# Patient Record
Sex: Female | Born: 1945 | Race: White | Hispanic: No | Marital: Married | Smoking: Never smoker
Health system: Southern US, Community
[De-identification: ages and names within clinical notes are randomized; demographics above are authoritative.]

## PROBLEM LIST (undated history)

## (undated) DIAGNOSIS — M810 Age-related osteoporosis without current pathological fracture: Secondary | ICD-10-CM

## (undated) DIAGNOSIS — H269 Unspecified cataract: Secondary | ICD-10-CM

## (undated) DIAGNOSIS — T7840XA Allergy, unspecified, initial encounter: Secondary | ICD-10-CM

## (undated) DIAGNOSIS — I10 Essential (primary) hypertension: Secondary | ICD-10-CM

## (undated) DIAGNOSIS — E7800 Pure hypercholesterolemia, unspecified: Secondary | ICD-10-CM

## (undated) HISTORY — PX: COSMETIC SURGERY: SHX468

## (undated) HISTORY — DX: Allergy, unspecified, initial encounter: T78.40XA

## (undated) HISTORY — DX: Pure hypercholesterolemia, unspecified: E78.00

## (undated) HISTORY — DX: Essential (primary) hypertension: I10

## (undated) HISTORY — PX: HERNIA REPAIR: SHX51

## (undated) HISTORY — PX: VAGINAL HYSTERECTOMY: SUR661

## (undated) HISTORY — DX: Age-related osteoporosis without current pathological fracture: M81.0

## (undated) HISTORY — DX: Unspecified cataract: H26.9

---

## 1985-10-09 HISTORY — PX: NASAL SEPTUM SURGERY: SHX37

## 2013-10-09 HISTORY — PX: LAPAROSCOPIC INTERNAL HERNIA REPAIR: SHX6502

## 2015-03-31 DIAGNOSIS — M810 Age-related osteoporosis without current pathological fracture: Secondary | ICD-10-CM | POA: Diagnosis not present

## 2015-07-22 DIAGNOSIS — R7301 Impaired fasting glucose: Secondary | ICD-10-CM | POA: Diagnosis not present

## 2015-07-22 DIAGNOSIS — I10 Essential (primary) hypertension: Secondary | ICD-10-CM | POA: Diagnosis not present

## 2015-07-22 DIAGNOSIS — E785 Hyperlipidemia, unspecified: Secondary | ICD-10-CM | POA: Diagnosis not present

## 2015-07-29 DIAGNOSIS — Z1389 Encounter for screening for other disorder: Secondary | ICD-10-CM | POA: Diagnosis not present

## 2015-07-29 DIAGNOSIS — R7301 Impaired fasting glucose: Secondary | ICD-10-CM | POA: Diagnosis not present

## 2015-07-29 DIAGNOSIS — Z23 Encounter for immunization: Secondary | ICD-10-CM | POA: Diagnosis not present

## 2015-07-29 DIAGNOSIS — E785 Hyperlipidemia, unspecified: Secondary | ICD-10-CM | POA: Diagnosis not present

## 2015-07-29 DIAGNOSIS — Z139 Encounter for screening, unspecified: Secondary | ICD-10-CM | POA: Diagnosis not present

## 2015-07-29 DIAGNOSIS — Z0000 Encounter for general adult medical examination without abnormal findings: Secondary | ICD-10-CM | POA: Diagnosis not present

## 2015-07-29 DIAGNOSIS — I10 Essential (primary) hypertension: Secondary | ICD-10-CM | POA: Diagnosis not present

## 2015-09-23 DIAGNOSIS — M810 Age-related osteoporosis without current pathological fracture: Secondary | ICD-10-CM | POA: Diagnosis not present

## 2017-08-22 DIAGNOSIS — E559 Vitamin D deficiency, unspecified: Secondary | ICD-10-CM | POA: Insufficient documentation

## 2019-07-14 ENCOUNTER — Other Ambulatory Visit: Payer: Self-pay

## 2019-07-14 ENCOUNTER — Encounter: Payer: Self-pay | Admitting: Osteopathic Medicine

## 2019-07-14 ENCOUNTER — Ambulatory Visit (INDEPENDENT_AMBULATORY_CARE_PROVIDER_SITE_OTHER): Payer: Medicare Other | Admitting: Osteopathic Medicine

## 2019-07-14 VITALS — BP 135/79 | HR 81 | Temp 98.5°F | Ht 64.0 in | Wt 133.5 lb

## 2019-07-14 DIAGNOSIS — E782 Mixed hyperlipidemia: Secondary | ICD-10-CM

## 2019-07-14 DIAGNOSIS — Z23 Encounter for immunization: Secondary | ICD-10-CM | POA: Diagnosis not present

## 2019-07-14 DIAGNOSIS — M810 Age-related osteoporosis without current pathological fracture: Secondary | ICD-10-CM | POA: Diagnosis not present

## 2019-07-14 MED ORDER — ALENDRONATE SODIUM 70 MG PO TABS: 70.0000 mg | ORAL_TABLET | ORAL | 3 refills | Status: DC

## 2019-07-14 MED ORDER — SIMVASTATIN 40 MG PO TABS: 40.0000 mg | ORAL_TABLET | Freq: Every day | ORAL | 3 refills | Status: DC

## 2019-07-14 MED ORDER — ZOSTER VAC RECOMB ADJUVANTED 50 MCG/0.5ML IM SUSR: 0.5000 mL | Freq: Once | INTRAMUSCULAR | 1 refills | Status: AC

## 2019-07-14 NOTE — Progress Notes (Signed)
HPI: Heidi Barber is a 73 y.o. female who  has a past medical history of High cholesterol.  she presents to [facility] today, 07/14/19,  for chief complaint of: New to establish care History of high cholesterol, osteoporosis Questions about supplements  Pleasant new patient here to establish care.  Recently moved to the area from [place] in order to be closer to her daughter who lives locally.  She has another daughter who lives [place], she will be going to visit this daughter and grandson pretty soon.  Used to work as a Runner, broadcasting/film/video, she is retired now.  Hyperlipidemia: Takes simvastatin, gets decent amount of activity/exercise.  Osteoporosis: Takes Fosamax weekly.  No problems with this medication.  1 of her daughters recommended possibly taking turmeric and elderberry supplements to help with antioxidants/anti-inflammatory   Past medical, surgical, social and family history reviewed:  Patient Active Problem List   Diagnosis Date Noted  . Age-related osteoporosis without current pathological fracture 07/14/2019  . Mixed hyperlipidemia 07/14/2019    Past Surgical History:  Procedure Laterality Date  . LAPAROSCOPIC INTERNAL HERNIA REPAIR  2015  . NASAL SEPTUM SURGERY  1987  . VAGINAL HYSTERECTOMY      Social History   Tobacco Use  . Smoking status: Never Smoker  . Smokeless tobacco: Never Used  Substance Use Topics  . Alcohol use: Never    Frequency: Never    Family History  Problem Relation Age of Onset  . Stroke Mother   . High blood pressure Father   . Skin cancer Father      Current medication list and allergy/intolerance information reviewed:    Current Outpatient Medications  Medication Sig Dispense Refill  . alendronate (FOSAMAX) 70 MG tablet TAKE 1 TABLET BY MOUTH ONE TIME PER WEEK    . aspirin 81 MG EC tablet Take 81 mg by mouth daily. Swallow whole.    . Calcium Carbonate-Vitamin D (CALCIUM 600+D)  600-200 MG-UNIT TABS Take by mouth.    . Cholecalciferol (VITAMIN D3) 50 MCG (2000 UT) TABS Take by mouth.    . Coenzyme Q10 (COQ10) 100 MG CAPS Take by mouth.    . Multiple Vitamins-Minerals (OCUVITE ADULT 50+) CAPS Take by mouth.    . simvastatin (ZOCOR) 40 MG tablet TAKE ONE TABLET BY MOUTH EVERY DAY IN THE EVENING AT 6 PM    . Vitamin E 180 MG CAPS Take by mouth.     No current facility-administered medications for this visit.     Allergies  Allergen Reactions  . Sulfa Antibiotics Itching and Rash      Review of Systems:  Constitutional:  No  fever, no chills, No recent illness, No unintentional weight changes. No significant fatigue.   HEENT: No  headache, no vision change, no hearing change, No sore throat, No  sinus pressure  Cardiac: No  chest pain, No  pressure, No palpitations, No  Orthopnea  Respiratory:  No  shortness of breath. No  Cough  Gastrointestinal: No  abdominal pain, No  nausea, No  vomiting,  No  blood in stool, No  diarrhea, No  constipation   Musculoskeletal: No new myalgia/arthralgia  Skin: No  Rash, No other wounds/concerning lesions  Genitourinary: No  incontinence, No  abnormal genital bleeding, No abnormal genital discharge  Hem/Onc: No  easy bruising/bleeding, No  abnormal lymph node  Endocrine: No cold intolerance,  No heat intolerance. No polyuria/polydipsia/polyphagia   Neurologic: No  weakness, No  dizziness, No  slurred speech/focal weakness/facial droop  Psychiatric: No  concerns with depression, No  concerns with anxiety, No sleep problems, No mood problems  Exam:  BP 135/79 (BP Location: Left Arm, Patient Position: Sitting, Cuff Size: Normal)   Pulse 81   Temp 98.5 F (36.9 C) (Oral)   Ht 5\' 4"  (1.626 m)   Wt 133 lb 8 oz (60.6 kg)   BMI 22.92 kg/m   Constitutional: VS see above. General Appearance: alert, well-developed, well-nourished, NAD  Eyes: Normal lids and conjunctive, non-icteric sclera  Ears, Nose, Mouth, Throat:  TM normal bilaterally.   Neck: No masses, trachea midline. No thyroid enlargement. No tenderness/mass appreciated. No lymphadenopathy  Respiratory: Normal respiratory effort. no wheeze, no rhonchi, no rales  Cardiovascular: S1/S2 normal, no murmur, no rub/gallop auscultated. RRR. No lower extremity edema.   Gastrointestinal: Nontender, no masses. No hepatomegaly, no splenomegaly. No hernia appreciated. Bowel sounds normal. Rectal exam deferred.   Musculoskeletal: Gait normal. No clubbing/cyanosis of digits.   Neurological: Normal balance/coordination. No tremor. No cranial nerve deficit on limited exam. Motor and sensation intact and symmetric. Cerebellar reflexes intact.   Skin: warm, dry, intact. No rash/ulcer. No concerning nevi or subq nodules on limited exam.    Psychiatric: Normal judgment/insight. Normal mood and affect. Oriented x3.        ASSESSMENT/PLAN: The primary encounter diagnosis was Age-related osteoporosis without current pathological fracture. Diagnoses of Mixed hyperlipidemia and Need for shingles vaccine were also pertinent to this visit.  Doing well on current meds OK to try OTC supplements, not much evidence for beenfit   Meds ordered this encounter  Medications  . alendronate (FOSAMAX) 70 MG tablet    Sig: Take 1 tablet (70 mg total) by mouth once a week. Take with a full glass of water on an empty stomach.    Dispense:  12 tablet    Refill:  3  . simvastatin (ZOCOR) 40 MG tablet    Sig: Take 1 tablet (40 mg total) by mouth daily.    Dispense:  90 tablet    Refill:  3  . Zoster Vaccine Adjuvanted [facility]) injection    Sig: Inject 0.5 mLs into the muscle once for 1 dose. Repeat in 2-6 months. Please fax confirmation of vaccination to Dr Sheppard Coil [phone]    Dispense:  0.5 mL    Refill:  1        Visit summary with medication list and pertinent instructions was printed for patient to review. All questions at time of visit were answered -  patient instructed to contact office with any additional concerns or updates. ER/RTC precautions were reviewed with the patient.    Please note: voice recognition software was used to produce this document, and typos may escape review. Please contact Dr. Sheppard Coil for any needed clarifications.     Follow-up plan: Return in about 4 months (around 11/14/2019) for [place].

## 2019-11-10 NOTE — Progress Notes (Signed)
Subjective:   Heidi Barber is a 74 y.o. female who presents for Medicare Annual (Subsequent) preventive examination.  Review of Systems:  No ROS.  Medicare Wellness Virtual Visit.  Visual/audio telehealth visit, UTA vital signs.   See social history for additional risk factors.    Cardiac Risk Factors include: advanced age (>60men, >51 women) Sleep patterns:Getting 7-8 hours of sleep a night. Wakes up 1-2 times a night to void. Wakes up and feels rested and ready for the day   Home Safety/Smoke Alarms: Feels safe in home. Smoke alarms in place.  Living environment; Lives with husband in a 1 story home no stairs in or around the home. SHower is walk in shower and no grab bars in place. Seat Belt Safety/Bike Helmet: Wears seat belt.   Female:   Pap- Aged out      Mammo-  UTD     Dexa scan- declines       CCS- UTD     Objective:     Vitals: BP (!) 148/68   Pulse 87   Ht 5\' 4"  (1.626 m)   Wt 133 lb (60.3 kg)   SpO2 98%   BMI 22.83 kg/m   Body mass index is 22.83 kg/m.  Advanced Directives 11/17/2019  Does Patient Have a Medical Advance Directive? Yes  Type of 01/15/2020 of [place];Living will  Does patient want to make changes to medical advance directive? No - Patient declined  Copy of Healthcare Power of Attorney in Chart? No - copy requested    Tobacco Social History   Tobacco Use  Smoking Status Never Smoker  Smokeless Tobacco Never Used     Counseling given: No   Clinical Intake:  Pre-visit preparation completed: Yes  Pain : No/denies pain     Nutritional Risks: None Diabetes: No  How often do you need to have someone help you when you read instructions, pamphlets, or other written materials from your doctor or pharmacy?: 1 - Never What is the last grade level you completed in school?: 18  Interpreter Needed?: No  Information entered by :: [id number], LPN  Past Medical History:  Diagnosis Date  . High cholesterol     Past Surgical History:  Procedure Laterality Date  . LAPAROSCOPIC INTERNAL HERNIA REPAIR  2015  . NASAL SEPTUM SURGERY  1987  . VAGINAL HYSTERECTOMY     Family History  Problem Relation Age of Onset  . Stroke Mother   . High blood pressure Father   . Skin cancer Father   . Stroke Maternal Grandmother    Social History   Socioeconomic History  . Marital status: Married    Spouse name: Richard  . Number of children: 2  . Years of education: 32  . Highest education level: Master's degree (e.g., MA, MS, MEng, MEd, MSW, MBA)  Occupational History  . Occupation: Retired    Comment: 15  Tobacco Use  . Smoking status: Never Smoker  . Smokeless tobacco: Never Used  Substance and Sexual Activity  . Alcohol use: Never  . Drug use: Never  . Sexual activity: Not Currently    Partners: Male  Other Topics Concern  . Not on file  Social History Narrative   Retired Runner, broadcasting/film/video.  Coffee daily   Social Determinants of Health   Financial Resource Strain:   . Difficulty of Paying Living Expenses: Not on file  Food Insecurity:   . Worried About Runner, broadcasting/film/video in the Last Year: Not  on file  . Ran Out of Food in the Last Year: Not on file  Transportation Needs:   . Lack of Transportation (Medical): Not on file  . Lack of Transportation (Non-Medical): Not on file  Physical Activity:   . Days of Exercise per Week: Not on file  . Minutes of Exercise per Session: Not on file  Stress:   . Feeling of Stress : Not on file  Social Connections:   . Frequency of Communication with Friends and Family: Not on file  . Frequency of Social Gatherings with Friends and Family: Not on file  . Attends Religious Services: Not on file  . Active Member of Clubs or Organizations: Not on file  . Attends Archivist Meetings: Not on file  . Marital Status: Not on file    Outpatient Encounter Medications as of 11/17/2019  Medication Sig  . alendronate (FOSAMAX) 70 MG tablet Take 1  tablet (70 mg total) by mouth once a week. Take with a full glass of water on an empty stomach.  Marland Kitchen aspirin 81 MG EC tablet Take 81 mg by mouth daily. Swallow whole.  . Calcium Carbonate-Vitamin D (CALCIUM 600+D) 600-200 MG-UNIT TABS Take by mouth.  . Cholecalciferol (VITAMIN D3) 50 MCG (2000 UT) TABS Take by mouth.  . Coenzyme Q10 (COQ10) 100 MG CAPS Take by mouth.  . Multiple Vitamins-Minerals ([place] 50+) CAPS Take by mouth.  . simvastatin (ZOCOR) 40 MG tablet Take 1 tablet (40 mg total) by mouth daily.  . Vitamin E 180 MG CAPS Take by mouth.   No facility-administered encounter medications on file as of 11/17/2019.    Activities of Daily Living In your present state of health, do you have any difficulty performing the following activities: 11/17/2019  Hearing? N  Vision? N  Difficulty concentrating or making decisions? N  Walking or climbing stairs? N  Dressing or bathing? N  Doing errands, shopping? N  Preparing Food and eating ? N  Using the Toilet? N  In the past six months, have you accidently leaked urine? Y  Comment when laughs or coughs will notice some  Do you have problems with loss of bowel control? N  Managing your Medications? N  Managing your Finances? N  Housekeeping or managing your Housekeeping? N  Some recent data might be hidden    Patient Care Team: Emeterio Reeve, DO as PCP - General (Osteopathic Medicine)    Assessment:   This is a routine wellness examination for Heidi Barber.Physical assessment deferred to PCP.  Exercise Activities and Dietary recommendations Current Exercise Habits: Home exercise routine, Type of exercise: walking, Time (Minutes): 40, Frequency (Times/Week): 6, Weekly Exercise (Minutes/Week): 240, Intensity: Mild, Exercise limited by: None identified Diet  Vegan style diet with chicken and fish. Soy milk. No red meats Breakfast: cereal with toast and peanut butter. Lunch: skips Dinner:  Meat, vegetables and salad.   Drinks 1  bottle of water daily.  Goals    . Weight (lb) < 200 lb (90.7 kg)     Patient stated would like to loose 3-5 lbs       Fall Risk Fall Risk  11/17/2019  Falls in the past year? 0  Risk for fall due to : No Fall Risks  Follow up Falls prevention discussed   Is the patient's home free of loose throw rugs in walkways, pet beds, electrical cords, etc?   yes      Grab bars in the bathroom? no  Handrails on the stairs?   no      Adequate lighting?   yes   Depression Screen PHQ 2/9 Scores 07/14/2019  PHQ - 2 Score 0  PHQ- 9 Score 0     Cognitive Function     6CIT Screen 11/17/2019  What Year? 0 points  What month? 0 points  What time? 0 points  Count back from 20 0 points  Months in reverse 0 points  Repeat phrase 0 points  Total Score 0    Immunization History  Administered Date(s) Administered  . Fluad Quad(high Dose 65+) 06/10/2019  . Influenza-Unspecified 07/08/2014, 07/29/2015, 06/10/2019  . PFIZER SARS-COV-2 Vaccination 11/04/2019  . Pneumococcal Conjugate-13 12/07/2014  . Pneumococcal Polysaccharide-23 01/20/2016  . Td 06/05/2011  . Tdap 06/05/2011  . Zoster Recombinat (Shingrix) 11/29/2015    Screening Tests Health Maintenance  Topic Date Due  . DEXA SCAN  06/12/2011  . MAMMOGRAM  11/18/2020  . TETANUS/TDAP  03/09/2026  . COLONOSCOPY  08/06/2028  . INFLUENZA VACCINE  Completed  . Hepatitis C Screening  Completed  . PNA vac Low Risk Adult  Completed        Plan:    Please schedule your next medicare wellness visit with me in 1 yr.  Ms. Kirsch , Thank you for taking time to come for your Medicare Wellness Visit. I appreciate your ongoing commitment to your health goals. Please review the following plan we discussed and let me know if I can assist you in the future.   Continue doing brain stimulating activities (puzzles, reading, adult coloring books, staying active) to keep memory sharp.  Bring a copy of your living will and/or healthcare power of  attorney to your next office visit.   These are the goals we discussed: Goals    . Weight (lb) < 200 lb (90.7 kg)     Patient stated would like to loose 3-5 lbs       This is a list of the screening recommended for you and due dates:  Health Maintenance  Topic Date Due  . DEXA scan (bone density measurement)  06/12/2011  . Mammogram  11/18/2020  . Tetanus Vaccine  03/09/2026  . Colon Cancer Screening  08/06/2028  . Flu Shot  Completed  .  Hepatitis C: One time screening is recommended by Center for Disease Control  (CDC) for  adults born from [birth year] through 1965.   Completed  . Pneumonia vaccines  Completed      I have personally reviewed and noted the following in the patient's chart:   . Medical and social history . Use of alcohol, tobacco or illicit drugs  . Current medications and supplements . Functional ability and status . Nutritional status . Physical activity . Advanced directives . List of other physicians . Hospitalizations, surgeries, and ER visits in previous 12 months . Vitals . Screenings to include cognitive, depression, and falls . Referrals and appointments  In addition, I have reviewed and discussed with patient certain preventive protocols, quality metrics, and best practice recommendations. A written personalized care plan for preventive services as well as general preventive health recommendations were provided to patient.     Normand Sloop, LPN  03/09/4430

## 2019-11-17 ENCOUNTER — Other Ambulatory Visit: Payer: Self-pay

## 2019-11-17 ENCOUNTER — Ambulatory Visit (INDEPENDENT_AMBULATORY_CARE_PROVIDER_SITE_OTHER): Payer: Medicare PPO | Admitting: *Deleted

## 2019-11-17 VITALS — BP 148/68 | HR 87 | Ht 64.0 in | Wt 133.0 lb

## 2019-11-17 DIAGNOSIS — Z0000 Encounter for general adult medical examination without abnormal findings: Secondary | ICD-10-CM | POA: Diagnosis not present

## 2019-11-17 NOTE — Patient Instructions (Signed)
Please schedule your next medicare wellness visit with me in 1 yr.  Heidi Barber , Thank you for taking time to come for your Medicare Wellness Visit. I appreciate your ongoing commitment to your health goals. Please review the following plan we discussed and let me know if I can assist you in the future.   Continue doing brain stimulating activities (puzzles, reading, adult coloring books, staying active) to keep memory sharp.  Bring a copy of your living will and/or healthcare power of attorney to your next office visit.  These are the goals we discussed: Goals    . Weight (lb) < 200 lb (90.7 kg)     Patient stated would like to loose 3-5 lbs

## 2019-12-08 ENCOUNTER — Other Ambulatory Visit: Payer: Self-pay

## 2019-12-08 ENCOUNTER — Encounter: Payer: Self-pay | Admitting: Osteopathic Medicine

## 2019-12-08 ENCOUNTER — Ambulatory Visit (INDEPENDENT_AMBULATORY_CARE_PROVIDER_SITE_OTHER): Payer: Medicare PPO | Admitting: Osteopathic Medicine

## 2019-12-08 VITALS — BP 164/84 | HR 62 | Temp 98.5°F | Ht 64.0 in | Wt 134.0 lb

## 2019-12-08 DIAGNOSIS — E782 Mixed hyperlipidemia: Secondary | ICD-10-CM

## 2019-12-08 DIAGNOSIS — Z1231 Encounter for screening mammogram for malignant neoplasm of breast: Secondary | ICD-10-CM

## 2019-12-08 DIAGNOSIS — Z0000 Encounter for general adult medical examination without abnormal findings: Secondary | ICD-10-CM | POA: Diagnosis not present

## 2019-12-08 DIAGNOSIS — M810 Age-related osteoporosis without current pathological fracture: Secondary | ICD-10-CM

## 2019-12-08 MED ORDER — ALENDRONATE SODIUM 70 MG PO TABS: 70.0000 mg | ORAL_TABLET | ORAL | 3 refills | Status: DC

## 2019-12-08 NOTE — Progress Notes (Signed)
Heidi Barber is a 74 y.o. female who presents to  [place] [facility]  today, 12/08/19, seeking care for the following: . Annual      ASSESSMENT & PLAN with other pertinent history/findings:  The primary encounter diagnosis was Annual physical exam. Diagnoses of Mixed hyperlipidemia, Age-related osteoporosis without current pathological fracture, and Encounter for screening mammogram for malignant neoplasm of breast were also pertinent to this visit.  HTN above goal RTC nurse visit BP check - t would like to work on diet/exercise, recheck at home. Advised close f/u  BP Readings from Last 3 Encounters:  12/08/19 (!) 164/84, manual recheck 145/90  11/17/19 (!) 148/68  07/14/19 135/79      Patient Instructions  General Preventive Care  Most recent routine screening lipids/other labs: ordered.   Tobacco: don't! Alcohol: responsible moderation is ok for most adults - if you have concerns about your alcohol intake, please talk to me!   Exercise: as tolerated to reduce risk of cardiovascular disease and diabetes. Strength training will also prevent osteoporosis.   Mental health: if need for mental health care (medicines, counseling, other), or concerns about moods, please let me know!   Sexual health: if need for STD testing, or if concerns with libido/pain problems, please let me know!   Advanced Directive: Living Will and/or Healthcare Power of Attorney recommended for all adults, regardless of age or health.  Vaccines  Flu vaccine: recommended for almost everyone, every fall.   Shingles vaccine: Due for booster 12 weeks after last COVID vaccine!   Pneumonia vaccines: all done!   Tetanus booster: Tdap due 2027  COVID vaccine(s) ASAP!  Cancer screenings   Colon cancer screening: recommended for everyone at age 66-75  Breast cancer screening: mammogram annually, optional after age 35  Cervical cancer screening: Can  usually stop Pap at age 79 or w/ hysterectomy.   Lung cancer screening: not needed for non-smokers  Infection screenings . HIV: recommended screening at least once age 41-65, more often as needed. . Gonorrhea/Chlamydia: screening as needed . Hepatitis C: recommended once for anyone born 1945-1965, done!  . TB: certain at-risk populations, or depending on work requirements and/or travel history Other . Bone Density Test: recommended for women at age 29     Orders Placed This Encounter  Procedures  . DG BONE DENSITY (DXA)  . MM 3D SCREEN BREAST BILATERAL  . CBC  . COMPLETE METABOLIC PANEL WITH GFR  . Lipid panel    Meds ordered this encounter  Medications  . alendronate (FOSAMAX) 70 MG tablet    Sig: Take 1 tablet (70 mg total) by mouth once a week. Take with a full glass of water on an empty stomach.    Dispense:  12 tablet    Refill:  3       Follow-up instructions: Return in about 3 months (around 03/09/2020) for nurse visit BP check .                                         BP (!) 164/84   Pulse 62   Temp 98.5 F (36.9 C) (Oral)   Ht 5\' 4"  (1.626 m)   Wt 134 lb (60.8 kg)   SpO2 99% Comment: on RA  BMI 23.00 kg/m   Constitutional:  . VSS, see nurse notes . General Appearance: alert, well-developed, well-nourished, NAD  Eyes: . Normal lids and conjunctive, non-icteric sclera . PERRLA Ears, Nose, Mouth, Throat: . Normal external auditory canal and TM bilaterally Neck: . No masses, trachea midline . No thyroid enlargement/tenderness/mass appreciated Respiratory: . Normal respiratory effort . No dullness/hyper-resonance to percussion . Breath sounds normal, no wheeze/rhonchi/rales Cardiovascular: . S1/S2 normal, no murmur/rub/gallop auscultated . No carotid bruit or JVD . No lower extremity edema Gastrointestinal: . Nontender, no masses . No hepatomegaly, no splenomegaly . No hernia appreciated Musculoskeletal:   . Gait normal . No clubbing/cyanosis of digits Neurological: . No cranial nerve deficit on limited exam . Motor and sensation intact and symmetric Psychiatric: . Normal judgment/insight . Normal mood and affect    Current Meds  Medication Sig  . alendronate (FOSAMAX) 70 MG tablet Take 1 tablet (70 mg total) by mouth once a week. Take with a full glass of water on an empty stomach.  Marland Kitchen aspirin 81 MG EC tablet Take 81 mg by mouth daily. Swallow whole.  . Calcium Carbonate-Vitamin D (CALCIUM 600+D) 600-200 MG-UNIT TABS Take by mouth.  . Cholecalciferol (VITAMIN D3) 50 MCG (2000 UT) TABS Take by mouth.  . Coenzyme Q10 (COQ10) 100 MG CAPS Take by mouth.  . Multiple Vitamins-Minerals (OCUVITE ADULT 50+) CAPS Take by mouth.  . simvastatin (ZOCOR) 40 MG tablet Take 1 tablet (40 mg total) by mouth daily.  . Vitamin E 180 MG CAPS Take by mouth.  . [DISCONTINUED] alendronate (FOSAMAX) 70 MG tablet Take 1 tablet (70 mg total) by mouth once a week. Take with a full glass of water on an empty stomach.    No results found for this or any previous visit (from the past 72 hour(s)).  No results found.  Depression screen [facility] 2/9 12/08/2019 07/14/2019  Decreased Interest 0 0  Down, Depressed, Hopeless 0 0  PHQ - 2 Score 0 0  Altered sleeping 0 0  Tired, decreased energy 1 0  Change in appetite 1 0  Feeling bad or failure about yourself  0 0  Trouble concentrating 0 0  Moving slowly or fidgety/restless 0 0  Suicidal thoughts 0 0  PHQ-9 Score 2 0  Difficult doing work/chores Not difficult at all -    GAD 7 : Generalized Anxiety Score 12/08/2019 07/14/2019  Nervous, Anxious, on Edge 0 0  Control/stop worrying 0 0  Worry too much - different things 0 1  Trouble relaxing 0 0  Restless 0 0  Easily annoyed or irritable 0 0  Afraid - awful might happen 0 0  Total GAD 7 Score 0 1  Anxiety Difficulty - Not difficult at all      All questions at time of visit were answered - patient instructed  to contact office with any additional concerns or updates.  ER/RTC precautions were reviewed with the patient.  Please note: voice recognition software was used to produce this document, and typos may escape review. Please contact Dr. Lyn Hollingshead for any needed clarifications.

## 2019-12-08 NOTE — Patient Instructions (Addendum)
General Preventive Care  Most recent routine screening lipids/other labs: ordered.   Tobacco: don't! Alcohol: responsible moderation is ok for most adults - if you have concerns about your alcohol intake, please talk to me!   Exercise: as tolerated to reduce risk of cardiovascular disease and diabetes. Strength training will also prevent osteoporosis.   Mental health: if need for mental health care (medicines, counseling, other), or concerns about moods, please let me know!   Sexual health: if need for STD testing, or if concerns with libido/pain problems, please let me know!   Advanced Directive: Living Will and/or Healthcare Power of Attorney recommended for all adults, regardless of age or health.  Vaccines  Flu vaccine: recommended for almost everyone, every fall.   Shingles vaccine: Due for booster 12 weeks after last COVID vaccine!   Pneumonia vaccines: all done!   Tetanus booster: Tdap due 2027  COVID vaccine(s) ASAP!  Cancer screenings   Colon cancer screening: recommended for everyone at age 67-75  Breast cancer screening: mammogram annually, optional after age 52  Cervical cancer screening: Can usually stop Pap at age 38 or w/ hysterectomy.   Lung cancer screening: not needed for non-smokers  Infection screenings . HIV: recommended screening at least once age 12-65, more often as needed. . Gonorrhea/Chlamydia: screening as needed . Hepatitis C: recommended once for anyone born 1945-1965, done!  . TB: certain at-risk populations, or depending on work requirements and/or travel history Other . Bone Density Test: recommended for women at age 52

## 2019-12-11 LAB — COMPLETE METABOLIC PANEL WITH GFR
AG Ratio: 1.6 (calc) (ref 1.0–2.5)
ALT: 15 U/L (ref 6–29)
AST: 17 U/L (ref 10–35)
Albumin: 4.1 g/dL (ref 3.6–5.1)
Alkaline phosphatase (APISO): 67 U/L (ref 37–153)
BUN: 14 mg/dL (ref 7–25)
CO2: 29 mmol/L (ref 20–32)
Calcium: 9.7 mg/dL (ref 8.6–10.4)
Chloride: 106 mmol/L (ref 98–110)
Creat: 0.66 mg/dL (ref 0.60–0.93)
GFR, Est African American: 102 mL/min/{1.73_m2} (ref >=60)
GFR, Est Non African American: 88 mL/min/{1.73_m2} (ref >=60)
Globulin: 2.6 g/dL (calc) (ref 1.9–3.7)
Glucose, Bld: 96 mg/dL (ref 65–99)
Potassium: 4.6 mmol/L (ref 3.5–5.3)
Sodium: 141 mmol/L (ref 135–146)
Total Bilirubin: 0.4 mg/dL (ref 0.2–1.2)
Total Protein: 6.7 g/dL (ref 6.1–8.1)

## 2019-12-11 LAB — LIPID PANEL
Cholesterol: 149 mg/dL (ref <200)
HDL: 51 mg/dL (ref >=50)
LDL Cholesterol (Calc): 85 mg/dL (calc)
Non-HDL Cholesterol (Calc): 98 mg/dL (calc) (ref <130)
Total CHOL/HDL Ratio: 2.9 (calc) (ref <5.0)
Triglycerides: 57 mg/dL (ref <150)

## 2019-12-11 LAB — CBC
HCT: 39.5 % (ref 35.0–45.0)
Hemoglobin: 12.4 g/dL (ref 11.7–15.5)
MCH: 27 pg (ref 27.0–33.0)
MCHC: 31.4 g/dL — ABNORMAL LOW (ref 32.0–36.0)
MCV: 86.1 fL (ref 80.0–100.0)
MPV: 10.2 fL (ref 7.5–12.5)
Platelets: 307 10*3/uL (ref 140–400)
RBC: 4.59 10*6/uL (ref 3.80–5.10)
RDW: 17.9 % — ABNORMAL HIGH (ref 11.0–15.0)
WBC: 7.6 10*3/uL (ref 3.8–10.8)

## 2020-01-28 ENCOUNTER — Ambulatory Visit (INDEPENDENT_AMBULATORY_CARE_PROVIDER_SITE_OTHER): Payer: Medicare PPO

## 2020-01-28 ENCOUNTER — Other Ambulatory Visit: Payer: Self-pay

## 2020-01-28 DIAGNOSIS — Z780 Asymptomatic menopausal state: Secondary | ICD-10-CM | POA: Diagnosis not present

## 2020-01-28 DIAGNOSIS — Z1231 Encounter for screening mammogram for malignant neoplasm of breast: Secondary | ICD-10-CM

## 2020-01-28 DIAGNOSIS — M810 Age-related osteoporosis without current pathological fracture: Secondary | ICD-10-CM

## 2020-03-09 ENCOUNTER — Other Ambulatory Visit: Payer: Self-pay

## 2020-03-09 ENCOUNTER — Ambulatory Visit (INDEPENDENT_AMBULATORY_CARE_PROVIDER_SITE_OTHER): Payer: Medicare PPO | Admitting: Osteopathic Medicine

## 2020-03-09 VITALS — BP 147/78 | HR 63 | Ht 64.0 in | Wt 132.0 lb

## 2020-03-09 DIAGNOSIS — E782 Mixed hyperlipidemia: Secondary | ICD-10-CM

## 2020-03-09 DIAGNOSIS — R030 Elevated blood-pressure reading, without diagnosis of hypertension: Secondary | ICD-10-CM

## 2020-03-09 NOTE — Progress Notes (Signed)
LDL is not too low - this is the bad cholesterol so the lower the better  I don't she needs to change her Rx but if she'd like to take it every other day that's fine   BP Readings from Last 3 Encounters:  03/09/20 (!) 147/78  12/08/19 (!) 164/84  11/17/19 (!) 148/68

## 2020-03-09 NOTE — Progress Notes (Signed)
   Subjective:    Patient ID: Heidi Barber, female    DOB: 1945/10/24, 74 y.o.   MRN: [id number]  HPI Patient presents to office for 3 month blood pressure check. Patient denies any headaches or high blood pressure readings at home. Denies any increased stress and has minimized salt intake.    Review of Systems     Objective:   Physical Exam        Assessment & Plan:  First blood pressure check was elevated at 175/80.Patient sat and for 10 minutes and recheck was 147/78. Advised patient she should started checking blood pressures at home periodically and record them for [place].   Patient also expressed concerns for her LDL being "too low" at 75. Patient requesting change in medication where she is taking 30 mg of simvastatin a day or a total of 80 mg over a span of 3 days. Wanting to know the best way to do this.   HM: COVID vaccine documented. Patient advised to get second Shingrix at pharmacy and send [place] the date it was completed.

## 2020-03-10 MED ORDER — SIMVASTATIN 40 MG PO TABS: 40.0000 mg | ORAL_TABLET | ORAL | 3 refills | Status: DC

## 2020-03-10 NOTE — Addendum Note (Signed)
Addended by: Jed Limerick on: 03/10/2020 10:14 AM   Modules accepted: Orders

## 2020-03-10 NOTE — Progress Notes (Signed)
Patient advised and will start taking QOD, med list updated. Did you want any changes with BP medication?

## 2020-04-13 ENCOUNTER — Encounter: Payer: Self-pay | Admitting: Osteopathic Medicine

## 2020-04-26 ENCOUNTER — Emergency Department
Admission: EM | Admit: 2020-04-26 | Discharge: 2020-04-26 | Disposition: A | Discharge status: Home / Self Care | Payer: Medicare PPO | Source: Home / Self Care

## 2020-04-26 ENCOUNTER — Other Ambulatory Visit: Payer: Self-pay

## 2020-04-26 DIAGNOSIS — L02216 Cutaneous abscess of umbilicus: Secondary | ICD-10-CM | POA: Diagnosis not present

## 2020-04-26 MED ORDER — DOXYCYCLINE HYCLATE 100 MG PO CAPS: 100.0000 mg | ORAL_CAPSULE | Freq: Two times a day (BID) | ORAL | 0 refills | Status: AC

## 2020-04-26 NOTE — Discharge Instructions (Signed)
°  Please take antibiotics as prescribed and be sure to complete entire course even if you start to feel better to ensure infection does not come back. ° °Follow up in 3-4 days if not improving, sooner if worsening.  °

## 2020-04-26 NOTE — ED Provider Notes (Signed)
[facility]    CSN: [id number] Arrival date & time: 04/26/20  1208      History   Chief Complaint Chief Complaint  Patient presents with  . Wound Check    Surgical    HPI Heidi Barber is a 74 y.o. female.   HPI Heidi Barber is a 74 y.o. female presenting to UC with c/o 2-3 weeks of gradually worsening redness, swelling and oozing of blood and pus from umbilicus where she had laparoscopic hernia surgery in 2015.  She has not had any issues from the surgery until 2-3 weeks ago. She has tried OTC antibiotic ointment but no relief. Denies fever, chills, n/v/d.    Past Medical History:  Diagnosis Date  . High cholesterol     Patient Active Problem List   Diagnosis Date Noted  . Age-related osteoporosis without current pathological fracture 07/14/2019  . Mixed hyperlipidemia 07/14/2019    Past Surgical History:  Procedure Laterality Date  . LAPAROSCOPIC INTERNAL HERNIA REPAIR  2015  . NASAL SEPTUM SURGERY  1987  . VAGINAL HYSTERECTOMY      OB History   No obstetric history on file.      Home Medications    Prior to Admission medications   Medication Sig Start Date End Date Taking? Authorizing Provider  alendronate (FOSAMAX) 70 MG tablet Take 1 tablet (70 mg total) by mouth once a week. Take with a full glass of water on an empty stomach. 12/08/19   Sunnie Nielsen, DO  aspirin 81 MG EC tablet Take 81 mg by mouth daily. Swallow whole.    [provider]  Calcium Carbonate-Vitamin D (CALCIUM 600+D) 600-200 MG-UNIT TABS Take by mouth.    [provider]  Cholecalciferol (VITAMIN D3) 50 MCG (2000 UT) TABS Take by mouth.    [provider]  Coenzyme Q10 (COQ10) 100 MG CAPS Take by mouth.    [provider]  doxycycline (VIBRAMYCIN) 100 MG capsule Take 1 capsule (100 mg total) by mouth 2 (two) times daily for 7 days. 04/26/20 05/03/20  Lurene Shadow, PA-C  simvastatin (ZOCOR) 40 MG tablet Take 1 tablet (40 mg  total) by mouth every other day. 03/10/20   Sunnie Nielsen, DO  Vitamin E 180 MG CAPS Take by mouth.    [provider]    Family History Family History  Problem Relation Age of Onset  . Stroke Mother   . High blood pressure Father   . Skin cancer Father   . Hypertension Father   . Stroke Maternal Grandmother     Social History Social History   Tobacco Use  . Smoking status: Never Smoker  . Smokeless tobacco: Never Used  Vaping Use  . Vaping Use: Never used  Substance Use Topics  . Alcohol use: Never  . Drug use: Never     Allergies   Codeine and Sulfa antibiotics   Review of Systems Review of Systems  Constitutional: Negative for chills and fever.  Gastrointestinal: Positive for abdominal pain (umbilicus). Negative for diarrhea, nausea and vomiting.  Skin: Positive for color change and wound.     Physical Exam Triage Vital Signs ED Triage Vitals  Enc Vitals Group     BP 04/26/20 1222 (!) 145/85     Pulse Rate 04/26/20 1222 67     Resp 04/26/20 1222 18     Temp 04/26/20 1222 99 F (37.2 C)     Temp Source 04/26/20 1222 Oral     SpO2  04/26/20 1222 99 %     Weight --      Height --      Head Circumference --      Peak Flow --      Pain Score 04/26/20 1220 0     Pain Loc --      Pain Edu? --      Excl. in GC? --    No data found.  Updated Vital Signs BP (!) 145/85 (BP Location: Left Arm)   Pulse 67   Temp 99 F (37.2 C) (Oral)   Resp 18   SpO2 99%   Visual Acuity Right Eye Distance:   Left Eye Distance:   Bilateral Distance:    Right Eye Near:   Left Eye Near:    Bilateral Near:     Physical Exam Vitals and nursing note reviewed.  Constitutional:      Appearance: Normal appearance. She is well-developed.  HENT:     Head: Normocephalic and atraumatic.  Cardiovascular:     Rate and Rhythm: Normal rate.  Pulmonary:     Effort: Pulmonary effort is normal.  Abdominal:    Musculoskeletal:        General: Normal range of  motion.     Cervical back: Normal range of motion.  Skin:    General: Skin is warm and dry.  Neurological:     Mental Status: She is alert and oriented to person, place, and time.  Psychiatric:        Behavior: Behavior normal.      UC Treatments / Results  Labs (all labs ordered are listed, but only abnormal results are displayed) Labs Reviewed - No data to display  EKG   Radiology No results found.  Procedures Procedures (including critical care time)  Medications Ordered in UC Medications - No data to display  Initial Impression / Assessment and Plan / UC Course  I have reviewed the triage vital signs and the nursing notes.  Pertinent labs & imaging results that were available during my care of the patient were reviewed by me and considered in my medical decision making (see chart for details).     Hx and exam c/w skin abscess Will tx with doxycycline Encouraged warm compresses F/u with PCP 3-4 days, may return to UC if needed AVS given  Final Clinical Impressions(s) / UC Diagnoses   Final diagnoses:  Cutaneous abscess of umbilicus     Discharge Instructions      Please take antibiotics as prescribed and be sure to complete entire course even if you start to feel better to ensure infection does not come back.  Follow up in 3-4 days if not improving, sooner if worsening.    ED Prescriptions    Medication Sig Dispense Auth. Provider   doxycycline (VIBRAMYCIN) 100 MG capsule Take 1 capsule (100 mg total) by mouth 2 (two) times daily for 7 days. 14 capsule Lurene Shadow, [place]     PDMP not reviewed this encounter.   Lurene Shadow, [place] 04/26/20 1347

## 2020-04-26 NOTE — ED Triage Notes (Signed)
Patient presents to Urgent Care with complaints of oozing surgical site since 2-3 weeks ago. Patient reports she had an umbilical hernia repair in 2015. Pt states some yellowish fluid leaks out, also some blood but very light. Area is inflamed and draining upon arrival. Pt tried to get in w her PCP but her call has not yet been returned. Pt has been applying antibiotic ointment to the wound.

## 2020-04-30 ENCOUNTER — Encounter: Payer: Self-pay | Admitting: Osteopathic Medicine

## 2020-04-30 ENCOUNTER — Ambulatory Visit (INDEPENDENT_AMBULATORY_CARE_PROVIDER_SITE_OTHER): Payer: Medicare PPO | Admitting: Osteopathic Medicine

## 2020-04-30 VITALS — BP 133/79 | HR 63 | Wt 133.0 lb

## 2020-04-30 DIAGNOSIS — L0291 Cutaneous abscess, unspecified: Secondary | ICD-10-CM | POA: Diagnosis not present

## 2020-04-30 NOTE — Progress Notes (Signed)
HPI: Heidi Barber is a 74 y.o. female who  has a past medical history of High cholesterol.  she presents to [facility] today, 04/30/20,  for chief complaint of: Abscess follow up  Patient reports she went to UC 4 days ago for an area of redness and swelling near her belly button. She states she previously had a surgical repair of an umbilical hernia in that area in 2015, but has not had any issues since that time. UC told her the area was superficial and would drain on its own, and discharged her with 7 days of doxycycline. She states the redness and swelling have improved since then. She has kept a bandage on the area because there has been continuous drainage of clear mucous-like fluid and a very small amount of blood. But she states there has never been significant pain to the area. She denies fever, chills, abdominal pain, nausea, vomiting, diarrhea, constipation.       Past medical, surgical, social and family history reviewed:  Patient Active Problem List   Diagnosis Date Noted  . Age-related osteoporosis without current pathological fracture 07/14/2019  . Mixed hyperlipidemia 07/14/2019    Past Surgical History:  Procedure Laterality Date  . LAPAROSCOPIC INTERNAL HERNIA REPAIR  2015  . NASAL SEPTUM SURGERY  1987  . VAGINAL HYSTERECTOMY      Social History   Tobacco Use  . Smoking status: Never Smoker  . Smokeless tobacco: Never Used  Substance Use Topics  . Alcohol use: Never    Family History  Problem Relation Age of Onset  . Stroke Mother   . High blood pressure Father   . Skin cancer Father   . Hypertension Father   . Stroke Maternal Grandmother      Current medication list and allergy/intolerance information reviewed:    Current Outpatient Medications  Medication Sig Dispense Refill  . alendronate (FOSAMAX) 70 MG tablet Take 1 tablet (70 mg total) by mouth once a week. Take with a full glass of water on an empty  stomach. 12 tablet 3  . aspirin 81 MG EC tablet Take 81 mg by mouth daily. Swallow whole.    . Calcium Carbonate-Vitamin D (CALCIUM 600+D) 600-200 MG-UNIT TABS Take by mouth.    . Cholecalciferol (VITAMIN D3) 50 MCG (2000 UT) TABS Take by mouth.    . Coenzyme Q10 (COQ10) 100 MG CAPS Take by mouth.    . doxycycline (VIBRAMYCIN) 100 MG capsule Take 1 capsule (100 mg total) by mouth 2 (two) times daily for 7 days. 14 capsule 0  . simvastatin (ZOCOR) 40 MG tablet Take 1 tablet (40 mg total) by mouth every other day. 90 tablet 3  . Vitamin E 180 MG CAPS Take by mouth.     No current facility-administered medications for this visit.    Allergies  Allergen Reactions  . Morphine And Related Nausea And Vomiting  . Codeine Nausea Only  . Other     Hay fever  . Sulfa Antibiotics Itching and Rash      Review of Systems:  Constitutional:  No  fever, no chills  HEENT: No  headache  Cardiac: No  chest pain  Respiratory:  No  shortness of breath. No  Cough  Gastrointestinal: No  abdominal pain, No  nausea, No  vomiting, No  diarrhea, No  constipation   Musculoskeletal: No new myalgia/arthralgia  Skin: Red bump on stomach  Neurologic: No  weakness, No  dizziness  Psychiatric: No  concerns with depression, No  concerns with anxiety, No sleep problems, No mood problems  Exam:  BP (!) 133/79 (BP Location: Left Arm, Patient Position: Sitting)   Pulse 63   Wt 133 lb (60.3 kg)   SpO2 97%   BMI 22.83 kg/m   Constitutional: VS see above. General Appearance: alert, well-developed, well-nourished, NAD  Eyes: Normal lids and conjunctive, non-icteric sclera  Neck: No masses, trachea midline.  Respiratory: Normal respiratory effort. no wheeze, no rhonchi, no rales  Cardiovascular: S1/S2 normal, no murmur, no rub/gallop auscultated. RRR.   Gastrointestinal: Nontender, no masses.  Musculoskeletal: Gait normal. No clubbing/cyanosis of digits.   Neurological: Normal  balance/coordination. No tremor. No cranial nerve deficit on limited exam.   Skin: (Image below) 2cm x 2cm area of erythema and swelling just left of the umbilicus with a central area of ulceration with visible granulation tissue. Scant amount of serosanguinous drainage, but no purulent discharge. No tenderness to palpation. No fluctuance, nontender   Psychiatric: Normal judgment/insight. Normal mood and affect. Oriented x3.       No results found for this or any previous visit (from the past 72 hour(s)).  No results found.   ASSESSMENT/PLAN: The encounter diagnosis was Abscess.   Abscess vs late surgical scar complication / sinus   Area is firm and nontender. I&D would likely not elicit anything at this time. Area has improved with doxycycline treatment, and it appears to be healing with visible granulation tissue. No fluctuance noted to necessitate I& D at this time   Recommended patient complete doxycycline prescription (set to finish on Sunday 7/25) and send [place] a mychart message if wound worsens or does not improve by Tuesday.  Given it's at the site of previous surgical scar, would have low threshold for getting general surgery involved to help w/ management here if it's not healing or if it gets worse        Visit summary with medication list and pertinent instructions was printed for patient to review. All questions at time of visit were answered - patient instructed to contact office with any additional concerns or updates. ER/RTC precautions were reviewed with the patient.   Please note: voice recognition software was used to produce this document, and typos may escape review. Please contact Dr. Lyn Hollingshead for any needed clarifications.   Total time spent 30 mins   Follow-up plan: Return if symptoms worsen or fail to improve.   Heidi Barber, [facility] MS3

## 2020-07-22 ENCOUNTER — Other Ambulatory Visit: Payer: Self-pay | Admitting: Osteopathic Medicine

## 2020-11-17 ENCOUNTER — Ambulatory Visit: Payer: Medicare PPO

## 2020-11-26 ENCOUNTER — Ambulatory Visit: Payer: Medicare PPO | Admitting: Osteopathic Medicine

## 2020-11-26 ENCOUNTER — Other Ambulatory Visit: Payer: Self-pay

## 2020-11-26 VITALS — BP 149/78 | HR 66 | Temp 98.4°F | Ht 63.5 in | Wt 135.0 lb

## 2020-11-26 DIAGNOSIS — R030 Elevated blood-pressure reading, without diagnosis of hypertension: Secondary | ICD-10-CM

## 2020-11-26 DIAGNOSIS — Z0000 Encounter for general adult medical examination without abnormal findings: Secondary | ICD-10-CM

## 2020-11-26 DIAGNOSIS — M810 Age-related osteoporosis without current pathological fracture: Secondary | ICD-10-CM | POA: Diagnosis not present

## 2020-11-26 DIAGNOSIS — E782 Mixed hyperlipidemia: Secondary | ICD-10-CM

## 2020-11-26 NOTE — Patient Instructions (Addendum)
Fat and Cholesterol Restricted Eating Plan Eating a diet that limits fat and cholesterol may help lower your risk for heart disease and other conditions. Your body needs fat and cholesterol for basic functions, but eating too much of these things can be harmful to your health. Your health care provider may order lab tests to check your blood fat (lipid) and cholesterol levels. This helps your health care provider understand your risk for certain conditions and whether you need to make diet changes. Work with your health care provider or dietitian to make an eating plan that is right for you. Your plan includes:  Limit your fat intake to ______% or less of your total calories a day.  Limit your saturated fat intake to ______% or less of your total calories a day.  Limit the amount of cholesterol in your diet to less than _________mg a day.  Eat ___________ g of fiber a day. What are tips for following this plan? General guidelines  If you are overweight, work with your health care provider to lose weight safely. Losing just 5-10% of your body weight can improve your overall health and help prevent diseases such as diabetes and heart disease.  Avoid: ? Foods with added sugar. ? Fried foods. ? Foods that contain partially hydrogenated oils, including stick margarine, some tub margarines, cookies, crackers, and other baked goods.  Limit alcohol intake to no more than 1 drink a day for nonpregnant women and 2 drinks a day for men. One drink equals 12 oz of beer, 5 oz of wine, or 1 oz of hard liquor.   Reading food labels  Check food labels for: ? Trans fats, partially hydrogenated oils, or high amounts of saturated fat. Avoid foods that contain saturated fat and trans fat. ? The amount of cholesterol in each serving. Try to eat no more than 200 mg of cholesterol each day. ? The amount of fiber in each serving. Try to eat at least 20-30 g of fiber each day.  Choose foods with healthy fats,  such as: ? Monounsaturated and polyunsaturated fats. These include olive and canola oil, flaxseeds, walnuts, almonds, and seeds. ? Omega-3 fats. These are found in foods such as salmon, mackerel, sardines, tuna, flaxseed oil, and ground flaxseeds.  Choose grain products that have whole grains. Look for the word "whole" as the first word in the ingredient list. Cooking  Cook foods using methods other than frying. Baking, boiling, grilling, and broiling are some healthy options.  Eat more home-cooked food and less restaurant, buffet, and fast food.  Avoid cooking using saturated fats. ? Animal sources of saturated fats include meats, butter, and cream. ? Plant sources of saturated fats include palm oil, palm kernel oil, and coconut oil. Meal planning  At meals, imagine dividing your plate into fourths: ? Fill one-half of your plate with vegetables and green salads. ? Fill one-fourth of your plate with whole grains. ? Fill one-fourth of your plate with lean protein foods.  Eat fish that is high in omega-3 fats at least two times a week.  Eat more foods that contain fiber, such as whole grains, beans, apples, broccoli, carrots, peas, and barley. These foods help promote healthy cholesterol levels in the blood.   Recommended foods Grains  Whole grains, such as whole wheat or whole grain breads, crackers, cereals, and pasta. Unsweetened oatmeal, bulgur, barley, quinoa, or brown rice. Corn or whole wheat flour tortillas. Vegetables  Fresh or frozen vegetables (raw, steamed, roasted, or grilled). Green   Green salads. Fruits  All fresh, canned (in natural juice), or frozen fruits. Meats and other protein foods  Ground beef (85% or leaner), grass-fed beef, or beef trimmed of fat. Skinless chicken or [place]. Ground chicken or [place]. Pork trimmed of fat. All fish and seafood. Egg whites. Dried beans, peas, or lentils. Unsalted nuts or seeds. Unsalted canned beans. Natural nut butters without  added sugar and oil. Dairy  Low-fat or nonfat dairy products, such as skim or 1% milk, 2% or reduced-fat cheeses, low-fat and fat-free ricotta or cottage cheese, or plain low-fat and nonfat yogurt. Fats and oils  Tub margarine without trans fats. Light or reduced-fat mayonnaise and salad dressings. Avocado. Olive, canola, sesame, or safflower oils. The items listed above may not be a complete list of foods and beverages you can eat. Contact a dietitian for more information. Foods to avoid Grains  White bread. White pasta. White rice. Cornbread. Bagels, pastries, and croissants. Crackers and snack foods that contain trans fat and hydrogenated oils. Vegetables  Vegetables cooked in cheese, cream, or butter sauce. Fried vegetables. Fruits  Canned fruit in heavy syrup. Fruit in cream or butter sauce. Fried fruit. Meats and other protein foods  Fatty cuts of meat. Ribs, chicken wings, bacon, sausage, bologna, salami, chitterlings, fatback, hot dogs, bratwurst, and packaged lunch meats. Liver and organ meats. Whole eggs and egg yolks. Chicken and [place] with skin. Fried meat. Dairy  Whole or 2% milk, cream, half-and-half, and cream cheese. Whole milk cheeses. Whole-fat or sweetened yogurt. Full-fat cheeses. Nondairy creamers and whipped toppings. Processed cheese, cheese spreads, and cheese curds. Beverages  Alcohol. Sugar-sweetened drinks such as sodas, lemonade, and fruit drinks. Fats and oils  Butter, stick margarine, lard, shortening, ghee, or bacon fat. Coconut, palm kernel, and palm oils. Sweets and desserts  Corn syrup, sugars, honey, and molasses. Candy. Jam and jelly. Syrup. Sweetened cereals. Cookies, pies, cakes, donuts, muffins, and ice cream. The items listed above may not be a complete list of foods and beverages you should avoid. Contact a dietitian for more information. Summary  Your body needs fat and cholesterol for basic functions. However, eating too much of these  things can be harmful to your health.  Work with your health care provider and dietitian to follow a diet low in fat and cholesterol. Doing this may help lower your risk for heart disease and other conditions.  Choose healthy fats, such as monounsaturated and polyunsaturated fats, and foods high in omega-3 fatty acids.  Eat fiber-rich foods, such as whole grains, beans, peas, fruits, and vegetables.  Limit or avoid alcohol, fried foods, and foods high in saturated fats, partially hydrogenated oils, and sugar. This information is not intended to replace advice given to you by your health care provider. Make sure you discuss any questions you have with your health care provider. Document Revised: 05/26/2020 Document Reviewed: 01/28/2020 Elsevier Patient Education  2021 [employer]   Health Maintenance, Female Adopting a healthy lifestyle and getting preventive care are important in promoting health and wellness. Ask your health care provider about:  The right schedule for you to have regular tests and exams.  Things you can do on your own to prevent diseases and keep yourself healthy. What should I know about diet, weight, and exercise? Eat a healthy diet  Eat a diet that includes plenty of vegetables, fruits, low-fat dairy products, and lean protein.  Do not eat a lot of foods that are high in solid fats, added sugars, or sodium.  Maintain a healthy weight Body mass index (BMI) is used to identify weight problems. It estimates body fat based on height and weight. Your health care provider can help determine your BMI and help you achieve or maintain a healthy weight. Get regular exercise Get regular exercise. This is one of the most important things you can do for your health. Most adults should:  Exercise for at least 150 minutes each week. The exercise should increase your heart rate and make you sweat (moderate-intensity exercise).  Do strengthening exercises at least twice a  week. This is in addition to the moderate-intensity exercise.  Spend less time sitting. Even light physical activity can be beneficial. Watch cholesterol and blood lipids Have your blood tested for lipids and cholesterol at 75 years of age, then have this test every 5 years. Have your cholesterol levels checked more often if:  Your lipid or cholesterol levels are high.  You are older than 75 years of age.  You are at high risk for heart disease. What should I know about cancer screening? Depending on your health history and family history, you may need to have cancer screening at various ages. This may include screening for:  Breast cancer.  Cervical cancer.  Colorectal cancer.  Skin cancer.  Lung cancer. What should I know about heart disease, diabetes, and high blood pressure? Blood pressure and heart disease  High blood pressure causes heart disease and increases the risk of stroke. This is more likely to develop in people who have high blood pressure readings, are of African descent, or are overweight.  Have your blood pressure checked: ? Every 3-5 years if you are 5618-75 years of age. ? Every year if you are 75 years old or older. Diabetes Have regular diabetes screenings. This checks your fasting blood sugar level. Have the screening done:  Once every three years after age 75 if you are at a normal weight and have a low risk for diabetes.  More often and at a younger age if you are overweight or have a high risk for diabetes. What should I know about preventing infection? Hepatitis B If you have a higher risk for hepatitis B, you should be screened for this virus. Talk with your health care provider to find out if you are at risk for hepatitis B infection. Hepatitis C Testing is recommended for:  Everyone born from [birth year] through 1965.  Anyone with known risk factors for hepatitis C. Sexually transmitted infections (STIs)  Get screened for STIs, including gonorrhea  and chlamydia, if: ? You are sexually active and are younger than 75 years of age. ? You are older than 75 years of age and your health care provider tells you that you are at risk for this type of infection. ? Your sexual activity has changed since you were last screened, and you are at increased risk for chlamydia or gonorrhea. Ask your health care provider if you are at risk.  Ask your health care provider about whether you are at high risk for HIV. Your health care provider may recommend a prescription medicine to help prevent HIV infection. If you choose to take medicine to prevent HIV, you should first get tested for HIV. You should then be tested every 3 months for as long as you are taking the medicine. Pregnancy  If you are about to stop having your period (premenopausal) and you may become pregnant, seek counseling before you get pregnant.  Take 400 to 800 micrograms (mcg) of folic  acid every day if you become pregnant.  Ask for birth control (contraception) if you want to prevent pregnancy. Osteoporosis and menopause Osteoporosis is a disease in which the bones lose minerals and strength with aging. This can result in bone fractures. If you are 49 years old or older, or if you are at risk for osteoporosis and fractures, ask your health care provider if you should:  Be screened for bone loss.  Take a calcium or vitamin D supplement to lower your risk of fractures.  Be given hormone replacement therapy (HRT) to treat symptoms of menopause. Follow these instructions at home: Lifestyle  Do not use any products that contain nicotine or tobacco, such as cigarettes, e-cigarettes, and chewing tobacco. If you need help quitting, ask your health care provider.  Do not use street drugs.  Do not share needles.  Ask your health care provider for help if you need support or information about quitting drugs. Alcohol use  Do not drink alcohol if: ? Your health care provider tells you not  to drink. ? You are pregnant, may be pregnant, or are planning to become pregnant.  If you drink alcohol: ? Limit how much you use to 0-1 drink a day. ? Limit intake if you are breastfeeding.  Be aware of how much alcohol is in your drink. In the [place] one drink equals one 12 oz bottle of beer (355 mL), one 5 oz glass of wine (148 mL), or one 1 oz glass of hard liquor (44 mL). General instructions  Schedule regular health, dental, and eye exams.  Stay current with your vaccines.  Tell your health care provider if: ? You often feel depressed. ? You have ever been abused or do not feel safe at home. Summary  Adopting a healthy lifestyle and getting preventive care are important in promoting health and wellness.  Follow your health care provider's instructions about healthy diet, exercising, and getting tested or screened for diseases.  Follow your health care provider's instructions on monitoring your cholesterol and blood pressure. This information is not intended to replace advice given to you by your health care provider. Make sure you discuss any questions you have with your health care provider. Document Revised: 09/18/2018 Document Reviewed: 09/18/2018 Elsevier Patient Education  2021 [employer] Inc.    MEDICARE [place] VISIT Health Maintenance Summary and Written Plan of Care  Ms. Kvamme ,  Thank you for allowing me to perform your Medicare Annual Wellness Visit and for your ongoing commitment to your health.   Health Maintenance & Immunization History Health Maintenance  Topic Date Due  . MAMMOGRAM  01/27/2022  . TETANUS/TDAP  03/09/2026  . COLONOSCOPY (Pts 45-41yrs Insurance coverage will need to be confirmed)  08/06/2028  . INFLUENZA VACCINE  Completed  . DEXA SCAN  Completed  . COVID-19 Vaccine  Completed  . Hepatitis C Screening  Completed  . PNA vac Low Risk Adult  Completed   Immunization History  Administered Date(s) Administered  . Fluad  Quad(high Dose 65+) 06/10/2019  . Influenza-Unspecified 07/08/2014, 07/29/2015, 06/10/2019, 07/08/2020  . Moderna Sars-Covid-2 Vaccination 08/09/2020  . PFIZER(Purple Top)SARS-COV-2 Vaccination 11/04/2019, 12/02/2019  . Pneumococcal Conjugate-13 11/10/2014, 12/07/2014  . Pneumococcal Polysaccharide-23 01/20/2016  . Td 06/05/2011  . Tdap 06/05/2011, 03/09/2016  . Zoster Recombinat (Shingrix) 11/29/2015    These are the patient goals that we discussed: Goals Addressed              This Visit's Progress   .  Patient Stated (  pt-stated)        11/26/2020 AWV Goal: Improved Nutrition/Diet  . Patient will verbalize understanding that diet plays an important role in overall health and that a poor diet is a risk factor for many chronic medical conditions.  . Over the next year, patient will improve self management of their diet by incorporating more water. . Patient will utilize available community resources to help with food acquisition if needed (ex: food pantries, [address], etc) . Patient will work with nutrition specialist if a referral was made         This is a list of Health Maintenance Items that are overdue or due now: There are no preventive care reminders to display for this patient.   Orders/Referrals Placed Today: No orders of the defined types were placed in this encounter.   Follow-up Plan . Follow-up with Sunnie Nielsen, DO as planned . Yearly lab orders will be sent to the lab to be drawn before your appointment on December 07, 2020 with Dr. Lyn Hollingshead.

## 2020-11-26 NOTE — Progress Notes (Signed)
MEDICARE ANNUAL WELLNESS VISIT  11/26/2020  Subjective:  Heidi Barber is a 75 y.o. female patient of Sunnie Nielsen, DO who had a Medicare Annual Wellness Visit today. Heidi Barber is Retired and lives with their spouse. Heidi Barber has 2 children. Heidi Barber reports that Heidi Barber is socially active and does interact with friends/family regularly. Heidi Barber is moderately physically active and enjoys gardening, talking to her grandson, writing, research and cooking.  Patient Care Team: Sunnie Nielsen, DO as PCP - General (Osteopathic Medicine)  Advanced Directives 11/26/2020 11/17/2019  Does Patient Have a Medical Advance Directive? Yes Yes  Type of Estate agent of [place];Living will Healthcare Power of [place];Living will  Does patient want to make changes to medical advance directive? No - Patient declined No - Patient declined  Copy of Healthcare Power of Attorney in Chart? No - copy requested No - copy requested    Hospital Utilization Over the Past 12 Months: # of hospitalizations or ER visits: 0 # of surgeries: 0  Review of Systems    Patient reports that her overall health is unchanged when compared to last year.  Review of Systems: History obtained from chart review and the patient  All other systems negative.  Pain Assessment Pain : No/denies pain     Current Medications & Allergies (verified) Allergies as of 11/26/2020      Reactions   Morphine And Related Nausea And Vomiting   Codeine Nausea Only   Other    Hay fever   Sulfa Antibiotics Itching, Rash      Medication List       Accurate as of November 26, 2020  9:33 AM. If you have any questions, ask your nurse or doctor.        alendronate 70 MG tablet Commonly known as: FOSAMAX Take 1 tablet (70 mg total) by mouth once a week. Take with a full glass of water on an empty stomach.   aspirin 81 MG EC tablet Take 81 mg by mouth daily. Swallow whole.   Calcium 600+D 600-200 MG-UNIT Tabs Generic  drug: Calcium Carbonate-Vitamin D Take by mouth.   CoQ10 100 MG Caps Take by mouth.   multivitamin-lutein Caps capsule Take 1 capsule by mouth daily. Once a day   simvastatin 40 MG tablet Commonly known as: ZOCOR TAKE 1 TABLET BY MOUTH EVERY DAY   Vitamin D3 50 MCG (2000 UT) Tabs Take by mouth.   Vitamin E 180 MG Caps Take by mouth.       History (reviewed): Past Medical History:  Diagnosis Date  . High cholesterol    Past Surgical History:  Procedure Laterality Date  . LAPAROSCOPIC INTERNAL HERNIA REPAIR  2015  . NASAL SEPTUM SURGERY  1987  . VAGINAL HYSTERECTOMY     Family History  Problem Relation Age of Onset  . Stroke Mother   . High blood pressure Father   . Skin cancer Father   . Hypertension Father   . Stroke Maternal Grandmother    Social History   Socioeconomic History  . Marital status: Married    Spouse name: Richard  . Number of children: 2  . Years of education: 53  . Highest education level: Master's degree (e.g., MA, MS, MEng, MEd, MSW, MBA)  Occupational History  . Occupation: Retired    Comment: Runner, broadcasting/film/video  Tobacco Use  . Smoking status: Never Smoker  . Smokeless tobacco: Never Used  Vaping Use  . Vaping Use: Never used  Substance and Sexual Activity  . Alcohol  use: Never    Comment: occasionally  . Drug use: Never  . Sexual activity: Not Currently    Partners: Male  Other Topics Concern  . Not on file  Social History Narrative   Retired Runner, broadcasting/film/videoteacher.  Coffee daily. Likes to do writing and research on her computer. Exercises 6 times a week.   Social Determinants of Health   Financial Resource Strain: Low Risk   . Difficulty of Paying Living Expenses: Not hard at all  Food Insecurity: No Food Insecurity  . Worried About Programme researcher, broadcasting/film/videounning Out of Food in the Last Year: Never true  . Ran Out of Food in the Last Year: Never true  Transportation Needs: No Transportation Needs  . Lack of Transportation (Medical): No  . Lack of Transportation  (Non-Medical): No  Physical Activity: Sufficiently Active  . Days of Exercise per Week: 6 days  . Minutes of Exercise per Session: 30 min  Stress: No Stress Concern Present  . Feeling of Stress : Not at all  Social Connections: Moderately Isolated  . Frequency of Communication with Friends and Family: More than three times a week  . Frequency of Social Gatherings with Friends and Family: Never  . Attends Religious Services: Never  . Active Member of Clubs or Organizations: No  . Attends BankerClub or Organization Meetings: Never  . Marital Status: Married    Activities of Daily Living In your present state of health, do you have any difficulty performing the following activities: 11/26/2020 12/08/2019  Hearing? N N  Vision? N N  Difficulty concentrating or making decisions? N N  Walking or climbing stairs? N N  Dressing or bathing? N N  Doing errands, shopping? N N  Preparing Food and eating ? N -  Using the Toilet? N -  In the past six months, have you accidently leaked urine? N -  Do you have problems with loss of bowel control? N -  Managing your Medications? N -  Managing your Finances? N -  Housekeeping or managing your Housekeeping? N -  Some recent data might be hidden    Patient Education/Literacy How often do you need to have someone help you when you read instructions, pamphlets, or other written materials from your doctor or pharmacy?: 1 - Never What is the last grade level you completed in school?: Masters Degree  Exercise Current Exercise Habits: Home exercise routine, Type of exercise: walking, Time (Minutes): 30, Frequency (Times/Week): 6, Weekly Exercise (Minutes/Week): 180, Intensity: Moderate, Exercise limited by: None identified  Diet Patient reports consuming 2 meals a day and 1 snack(s) a day Patient reports that her primary diet is: Regular Patient reports that Heidi Barber does have regular access to food.   Depression Screen PHQ 2/9 Scores 11/26/2020 12/08/2019  07/14/2019  PHQ - 2 Score 0 0 0  PHQ- 9 Score 0 2 0     Fall Risk Fall Risk  11/26/2020 12/08/2019 11/17/2019  Falls in the past year? 0 0 0  Number falls in past yr: 0 - -  Injury with Fall? 0 - -  Risk for fall due to : No Fall Risks - No Fall Risks  Follow up Falls evaluation completed Falls evaluation completed Falls prevention discussed     Objective:   BP (!) 149/78 (BP Location: Left Arm, Patient Position: Sitting, Cuff Size: Normal)   Pulse 66   Temp 98.4 F (36.9 C) (Oral)   Ht 5' 3.5" (1.613 m)   Wt 135 lb 0.6 oz (61.3 kg)  SpO2 98%   BMI 23.55 kg/m   Last Weight  Most recent update: 11/26/2020  9:03 AM   Weight  61.3 kg (135 lb 0.6 oz)            Body mass index is 23.55 kg/m.  Hearing/Vision  . Heidi Barber did not have difficulty with hearing/understanding during the face-to-face interview . Heidi Barber did not have difficulty with her vision during the face-to-face interview . Reports that Heidi Barber has had a formal eye exam by an eye care professional within the past year . Reports that Heidi Barber has not had a formal hearing evaluation within the past year  Cognitive Function: 6CIT Screen 11/26/2020 11/17/2019  What Year? 0 points 0 points  What month? 0 points 0 points  What time? 0 points 0 points  Count back from 20 0 points 0 points  Months in reverse 0 points 0 points  Repeat phrase 0 points 0 points  Total Score 0 0    Normal Cognitive Function Screening: Yes (Normal:0-7, Significant for Dysfunction: >8)  Immunization & Health Maintenance Record Immunization History  Administered Date(s) Administered  . Fluad Quad(high Dose 65+) 06/10/2019  . Influenza-Unspecified 07/08/2014, 07/29/2015, 06/10/2019, 07/08/2020  . Moderna Sars-Covid-2 Vaccination 08/09/2020  . PFIZER(Purple Top)SARS-COV-2 Vaccination 11/04/2019, 12/02/2019  . Pneumococcal Conjugate-13 11/10/2014, 12/07/2014  . Pneumococcal Polysaccharide-23 01/20/2016  . Td 06/05/2011  . Tdap 06/05/2011,  03/09/2016  . Zoster Recombinat (Shingrix) 11/29/2015    Health Maintenance  Topic Date Due  . MAMMOGRAM  01/27/2022  . TETANUS/TDAP  03/09/2026  . COLONOSCOPY (Pts 45-20yrs Insurance coverage will need to be confirmed)  08/06/2028  . INFLUENZA VACCINE  Completed  . DEXA SCAN  Completed  . COVID-19 Vaccine  Completed  . Hepatitis C Screening  Completed  . PNA vac Low Risk Adult  Completed       Assessment  This is a routine wellness examination for Heidi Barber.  Health Maintenance: Due or Overdue There are no preventive care reminders to display for this patient.  Heidi Barber does not need a referral for Community Assistance: Care Management:   no Social Work:    no Prescription Assistance:  no Nutrition/Diabetes Education:  no   Plan:  Personalized Goals Goals Addressed              This Visit's Progress   .  Patient Stated (pt-stated)        11/26/2020 AWV Goal: Improved Nutrition/Diet  . Patient will verbalize understanding that diet plays an important role in overall health and that a poor diet is a risk factor for many chronic medical conditions.  . Over the next year, patient will improve self management of their diet by incorporating more water. . Patient will utilize available community resources to help with food acquisition if needed (ex: food pantries, [address], etc) . Patient will work with nutrition specialist if a referral was made       Personalized Health Maintenance & Screening Recommendations  There are no preventive care reminders to display for this patient.   Lung Cancer Screening Recommended: no (Low Dose CT Chest recommended if Age 50-80 years, 30 pack-year currently smoking OR have quit w/in past 15 years) Hepatitis C Screening recommended: no HIV Screening recommended: no  Advanced Directives: Written information was not given per the patient's request.  Referrals & Orders No orders of the defined types were placed in  this encounter.   Follow-up Plan . Follow-up with Sunnie Nielsen, DO as planned .  Yearly lab orders will be sent to the lab to be drawn before your appointment on December 07, 2020 with Dr. Lyn Hollingshead.   I have personally reviewed and noted the following in the patient's chart:   . Medical and social history . Use of alcohol, tobacco or illicit drugs  . Current medications and supplements . Functional ability and status . Nutritional status . Physical activity . Advanced directives . List of other physicians . Hospitalizations, surgeries, and ER visits in previous 12 months . Vitals . Screenings to include cognitive, depression, and falls . Referrals and appointments  In addition, I have reviewed and discussed with patient certain preventive protocols, quality metrics, and best practice recommendations. A written personalized care plan for preventive services as well as general preventive health recommendations were provided to patient.     Modesto Charon, RN  11/26/2020

## 2020-11-26 NOTE — Addendum Note (Signed)
Addended by: Deirdre Pippins on: 11/26/2020 01:13 PM   Modules accepted: Orders

## 2020-11-29 DIAGNOSIS — M810 Age-related osteoporosis without current pathological fracture: Secondary | ICD-10-CM | POA: Diagnosis not present

## 2020-11-29 DIAGNOSIS — E782 Mixed hyperlipidemia: Secondary | ICD-10-CM | POA: Diagnosis not present

## 2020-11-29 DIAGNOSIS — R030 Elevated blood-pressure reading, without diagnosis of hypertension: Secondary | ICD-10-CM | POA: Diagnosis not present

## 2020-11-29 DIAGNOSIS — Z0000 Encounter for general adult medical examination without abnormal findings: Secondary | ICD-10-CM | POA: Diagnosis not present

## 2020-11-29 LAB — COMPLETE METABOLIC PANEL WITH GFR
AG Ratio: 1.6 (calc) (ref 1.0–2.5)
ALT: 19 U/L (ref 6–29)
AST: 19 U/L (ref 10–35)
Albumin: 4.2 g/dL (ref 3.6–5.1)
Alkaline phosphatase (APISO): 64 U/L (ref 37–153)
BUN: 16 mg/dL (ref 7–25)
CO2: 29 mmol/L (ref 20–32)
Calcium: 9.4 mg/dL (ref 8.6–10.4)
Chloride: 106 mmol/L (ref 98–110)
Creat: 0.74 mg/dL (ref 0.60–0.93)
GFR, Est African American: 92 mL/min/{1.73_m2} (ref >=60)
GFR, Est Non African American: 80 mL/min/{1.73_m2} (ref >=60)
Globulin: 2.6 g/dL (calc) (ref 1.9–3.7)
Glucose, Bld: 99 mg/dL (ref 65–99)
Potassium: 4.2 mmol/L (ref 3.5–5.3)
Sodium: 141 mmol/L (ref 135–146)
Total Bilirubin: 0.6 mg/dL (ref 0.2–1.2)
Total Protein: 6.8 g/dL (ref 6.1–8.1)

## 2020-11-29 LAB — CBC
HCT: 43.1 % (ref 35.0–45.0)
Hemoglobin: 14.5 g/dL (ref 11.7–15.5)
MCH: 31.7 pg (ref 27.0–33.0)
MCHC: 33.6 g/dL (ref 32.0–36.0)
MCV: 94.3 fL (ref 80.0–100.0)
MPV: 10.8 fL (ref 7.5–12.5)
Platelets: 255 10*3/uL (ref 140–400)
RBC: 4.57 10*6/uL (ref 3.80–5.10)
RDW: 11.9 % (ref 11.0–15.0)
WBC: 6 10*3/uL (ref 3.8–10.8)

## 2020-11-29 LAB — LIPID PANEL
Cholesterol: 174 mg/dL (ref <200)
HDL: 56 mg/dL (ref >=50)
LDL Cholesterol (Calc): 99 mg/dL (calc)
Non-HDL Cholesterol (Calc): 118 mg/dL (calc) (ref <130)
Total CHOL/HDL Ratio: 3.1 (calc) (ref <5.0)
Triglycerides: 101 mg/dL (ref <150)

## 2020-12-07 ENCOUNTER — Encounter: Payer: Self-pay | Admitting: Osteopathic Medicine

## 2020-12-07 ENCOUNTER — Other Ambulatory Visit: Payer: Self-pay

## 2020-12-07 ENCOUNTER — Ambulatory Visit: Payer: Medicare PPO | Admitting: Osteopathic Medicine

## 2020-12-07 VITALS — BP 125/79 | HR 64 | Temp 97.6°F | Wt 136.0 lb

## 2020-12-07 DIAGNOSIS — Z0000 Encounter for general adult medical examination without abnormal findings: Secondary | ICD-10-CM

## 2020-12-07 DIAGNOSIS — R030 Elevated blood-pressure reading, without diagnosis of hypertension: Secondary | ICD-10-CM | POA: Diagnosis not present

## 2020-12-07 DIAGNOSIS — M810 Age-related osteoporosis without current pathological fracture: Secondary | ICD-10-CM | POA: Diagnosis not present

## 2020-12-07 DIAGNOSIS — Z1231 Encounter for screening mammogram for malignant neoplasm of breast: Secondary | ICD-10-CM | POA: Diagnosis not present

## 2020-12-07 DIAGNOSIS — E782 Mixed hyperlipidemia: Secondary | ICD-10-CM | POA: Diagnosis not present

## 2020-12-07 MED ORDER — ALENDRONATE SODIUM 70 MG PO TABS: 70.0000 mg | ORAL_TABLET | ORAL | 3 refills | Status: DC

## 2020-12-07 MED ORDER — SIMVASTATIN 40 MG PO TABS: 40.0000 mg | ORAL_TABLET | Freq: Every day | ORAL | 3 refills | Status: DC

## 2020-12-07 NOTE — Patient Instructions (Addendum)
General Preventive Care  Most recent routine screening labs: ordered.   Blood pressure goal 140/90 or less.   Tobacco: don't!   Alcohol: responsible moderation is ok for most adults - if you have concerns about your alcohol intake, please talk to me!   Exercise: as tolerated to reduce risk of cardiovascular disease and diabetes.  Mental health: if need for mental health care (medicines, counseling, other), or concerns about moods, please let me know!   Sexual / Reproductive health: if need for STD testing, or if concerns with libido/pain problems, please let me know!  Advanced Directive: Living Will and/or Healthcare Power of Attorney recommended for all adults, regardless of age or health.  Vaccines  Flu vaccine: for almost everyone, every fall.   Shingles vaccine: one of two vaccine is on file - If you've had the second shot, we need records. If you haven't, please ask your pharmacist about this ([facility] won't cover this in-office)   Pneumonia vaccines: all done!  Tetanus booster: every 10 years - due 2027  COVID vaccine: THANKS for getting your vaccine! :)  Cancer screenings   Colon cancer screening: for everyone age 47-75. Follow per GI   Breast cancer screening: mammogram up to age 53, optional to continue after that    Cervical cancer screening: Pap not needed if hysterectomy  Lung cancer screening: not needed for non-smokers  Infection screenings  . HIV: recommended screening at least once age 73-65 . Gonorrhea/Chlamydia: screening as needed . Hepatitis C: recommended once for everyone age 68-75 . TB: certain at-risk populations Other . Bone Density Test: due 01/2022

## 2020-12-07 NOTE — Progress Notes (Signed)
Heidi Barber is a 75 y.o. female who presents to  [facility] at [facility]  today, 12/07/20, seeking care for the following:  . Annual Physical  . FOllow BP - pt measuring at home, range 111-145 SBP / 70-88 DBP     ASSESSMENT & PLAN with other pertinent findings:  The primary encounter diagnosis was Annual physical exam. Diagnoses of Mixed hyperlipidemia, Age-related osteoporosis without current pathological fracture, and Elevated blood pressure reading were also pertinent to this visit.    Patient Instructions  General Preventive Care  Most recent routine screening labs: ordered.   Blood pressure goal 140/90 or less.   Tobacco: don't!   Alcohol: responsible moderation is ok for most adults - if you have concerns about your alcohol intake, please talk to me!   Exercise: as tolerated to reduce risk of cardiovascular disease and diabetes.  Mental health: if need for mental health care (medicines, counseling, other), or concerns about moods, please let me know!   Sexual / Reproductive health: if need for STD testing, or if concerns with libido/pain problems, please let me know!  Advanced Directive: Living Will and/or Healthcare Power of Attorney recommended for all adults, regardless of age or health.  Vaccines  Flu vaccine: for almost everyone, every fall.   Shingles vaccine: one of two vaccine is on file - If you've had the second shot, we need records. If you haven't, please ask your pharmacist about this ([facility] won't cover this in-office)   Pneumonia vaccines: all done!  Tetanus booster: every 10 years - due 2027  COVID vaccine: THANKS for getting your vaccine! :)  Cancer screenings   Colon cancer screening: for everyone age 75-75. Follow per GI   Breast cancer screening: mammogram up to age 79, optional to continue after that    Cervical cancer screening: Pap not needed if hysterectomy  Lung cancer screening:  not needed for non-smokers  Infection screenings  . HIV: recommended screening at least once age 17-65 . Gonorrhea/Chlamydia: screening as needed . Hepatitis C: recommended once for everyone age 25-75 . TB: certain at-risk populations Other . Bone Density Test: due 01/2022   No orders of the defined types were placed in this encounter.   Meds ordered this encounter  Medications  . simvastatin (ZOCOR) 40 MG tablet    Sig: Take 1 tablet (40 mg total) by mouth daily.    Dispense:  90 tablet    Refill:  3  . alendronate (FOSAMAX) 70 MG tablet    Sig: Take 1 tablet (70 mg total) by mouth once a week. Take with a full glass of water on an empty stomach.    Dispense:  12 tablet    Refill:  3     See below for relevant physical exam findings  See below for recent lab and imaging results reviewed  Medications, allergies, PMH, PSH, SocH, FamH reviewed below    Follow-up instructions: Return in about 1 year (around 12/07/2021) for MEDICARE WELLNESS W/ RN, LABS (FASTING: LIPID, CMC, CMP), W/ DR A: PHYSICAL & REVIEW LABS AFTER [facility].                                        Exam:  BP 125/79 (BP Location: Left Arm, Patient Position: Sitting, Cuff Size: Normal)   Pulse 64   Temp 97.6 F (36.4 C) (Oral)  Wt 136 lb (61.7 kg)   BMI 23.71 kg/m   Constitutional: VS see above. General Appearance: alert, well-developed, well-nourished, NAD  Neck: No masses, trachea midline.   Respiratory: Normal respiratory effort. no wheeze, no rhonchi, no rales  Cardiovascular: S1/S2 normal, no murmur, no rub/gallop auscultated. RRR.   Musculoskeletal: Gait normal. Symmetric and independent movement of all extremities  Abdominal: non-tender, non-distended, no appreciable organomegaly, neg Murphy's, BS WNLx4  Neurological: Normal balance/coordination. No tremor.  Skin: warm, dry, intact.   Psychiatric: Normal judgment/insight. Normal mood and affect. Oriented x3.    Current Meds  Medication Sig  . aspirin 81 MG EC tablet Take 81 mg by mouth daily. Swallow whole.  . Calcium Carbonate-Vitamin D (CALCIUM 600+D) 600-200 MG-UNIT TABS Take by mouth.  . Cholecalciferol (VITAMIN D3) 50 MCG (2000 UT) TABS Take by mouth.  . Coenzyme Q10 (COQ10) 100 MG CAPS Take by mouth.  . multivitamin-lutein (OCUVITE-LUTEIN) CAPS capsule Take 1 capsule by mouth daily. Once a day  . Vitamin E 180 MG CAPS Take by mouth.  . [DISCONTINUED] alendronate (FOSAMAX) 70 MG tablet Take 1 tablet (70 mg total) by mouth once a week. Take with a full glass of water on an empty stomach.  . [DISCONTINUED] simvastatin (ZOCOR) 40 MG tablet TAKE 1 TABLET BY MOUTH EVERY DAY    Allergies  Allergen Reactions  . Morphine And Related Nausea And Vomiting  . Codeine Nausea Only  . Other     Hay fever  . Sulfa Antibiotics Itching and Rash    Patient Active Problem List   Diagnosis Date Noted  . Age-related osteoporosis without current pathological fracture 07/14/2019  . Mixed hyperlipidemia 07/14/2019    Family History  Problem Relation Age of Onset  . Stroke Mother   . High blood pressure Father   . Skin cancer Father   . Hypertension Father   . Stroke Maternal Grandmother     Social History   Tobacco Use  Smoking Status Never Smoker  Smokeless Tobacco Never Used    Past Surgical History:  Procedure Laterality Date  . LAPAROSCOPIC INTERNAL HERNIA REPAIR  2015  . NASAL SEPTUM SURGERY  1987  . VAGINAL HYSTERECTOMY      Immunization History  Administered Date(s) Administered  . Fluad Quad(high Dose 65+) 06/10/2019  . Influenza-Unspecified 07/08/2014, 07/29/2015, 06/10/2019, 07/08/2020  . Moderna Sars-Covid-2 Vaccination 08/09/2020  . PFIZER(Purple Top)SARS-COV-2 Vaccination 11/04/2019, 12/02/2019  . Pneumococcal Conjugate-13 11/10/2014, 12/07/2014  . Pneumococcal Polysaccharide-23 01/20/2016  . Td 06/05/2011  . Tdap 06/05/2011, 03/09/2016  . Zoster Recombinat  (Shingrix) 11/29/2015    Recent Results (from the past 2160 hour(s))  CBC     Status: None   Collection Time: 11/29/20  8:33 AM  Result Value Ref Range   WBC 6.0 3.8 - 10.8 Thousand/uL   RBC 4.57 3.80 - 5.10 Million/uL   Hemoglobin 14.5 11.7 - 15.5 g/dL   HCT 83.6 62.9 - 47.6 %   MCV 94.3 80.0 - 100.0 fL   MCH 31.7 27.0 - 33.0 pg   MCHC 33.6 32.0 - 36.0 g/dL   RDW 54.6 50.3 - 54.6 %   Platelets 255 140 - 400 Thousand/uL   MPV 10.8 7.5 - 12.5 fL  COMPLETE METABOLIC PANEL WITH GFR     Status: None   Collection Time: 11/29/20  8:33 AM  Result Value Ref Range   Glucose, Bld 99 65 - 99 mg/dL    Comment: .  Fasting reference interval .    BUN 16 7 - 25 mg/dL   Creat 4.09 8.11 - 9.14 mg/dL    Comment: For patients >87 years of age, the reference limit for Creatinine is approximately 13% higher for people identified as African-American. .    GFR, Est Non African American 80 > OR = 60 mL/min/1.23m2   GFR, Est African American 92 > OR = 60 mL/min/1.56m2   BUN/Creatinine Ratio NOT APPLICABLE 6 - 22 (calc)   Sodium 141 135 - 146 mmol/L   Potassium 4.2 3.5 - 5.3 mmol/L   Chloride 106 98 - 110 mmol/L   CO2 29 20 - 32 mmol/L   Calcium 9.4 8.6 - 10.4 mg/dL   Total Protein 6.8 6.1 - 8.1 g/dL   Albumin 4.2 3.6 - 5.1 g/dL   Globulin 2.6 1.9 - 3.7 g/dL (calc)   AG Ratio 1.6 1.0 - 2.5 (calc)   Total Bilirubin 0.6 0.2 - 1.2 mg/dL   Alkaline phosphatase (APISO) 64 37 - 153 U/L   AST 19 10 - 35 U/L   ALT 19 6 - 29 U/L  Lipid panel     Status: None   Collection Time: 11/29/20  8:33 AM  Result Value Ref Range   Cholesterol 174 <200 mg/dL   HDL 56 > OR = 50 mg/dL   Triglycerides 782 <956 mg/dL   LDL Cholesterol (Calc) 99 mg/dL (calc)    Comment: Reference range: <100 . Desirable range <100 mg/dL for primary prevention;   <70 mg/dL for patients with CHD or diabetic patients  with > or = 2 CHD risk factors. Marland Kitchen LDL-C is now calculated using the Martin-Hopkins  calculation,  which is a validated novel method providing  better accuracy than the Friedewald equation in the  estimation of LDL-C.  Horald Pollen et al. Lenox Ahr. 2130;865(78): 2061-2068  (http://education.[facility].com/faq/FAQ164)    Total CHOL/HDL Ratio 3.1 <5.0 (calc)   Non-HDL Cholesterol (Calc) 118 <130 mg/dL (calc)    Comment: For patients with diabetes plus 1 major ASCVD risk  factor, treating to a non-HDL-C goal of <100 mg/dL  (LDL-C of <46 mg/dL) is considered a therapeutic  option.     No results found.     All questions at time of visit were answered - patient instructed to contact office with any additional concerns or updates. ER/RTC precautions were reviewed with the patient as applicable.   Please note: manual typing as well as voice recognition software may have been used to produce this document - typos may escape review. Please contact Dr. Lyn Hollingshead for any needed clarifications.

## 2021-01-06 ENCOUNTER — Encounter: Payer: Self-pay | Admitting: Osteopathic Medicine

## 2021-02-09 ENCOUNTER — Other Ambulatory Visit: Payer: Self-pay

## 2021-02-09 ENCOUNTER — Ambulatory Visit (INDEPENDENT_AMBULATORY_CARE_PROVIDER_SITE_OTHER): Payer: Medicare PPO

## 2021-02-09 DIAGNOSIS — Z1231 Encounter for screening mammogram for malignant neoplasm of breast: Secondary | ICD-10-CM | POA: Diagnosis not present

## 2021-04-14 ENCOUNTER — Encounter: Payer: Self-pay | Admitting: Osteopathic Medicine

## 2021-04-19 MED ORDER — MINOXIDIL 5 % EX FOAM: 1.0000 "application " | Freq: Every day | CUTANEOUS | 11 refills | Status: DC

## 2021-06-07 DIAGNOSIS — L648 Other androgenic alopecia: Secondary | ICD-10-CM | POA: Diagnosis not present

## 2021-06-07 DIAGNOSIS — L821 Other seborrheic keratosis: Secondary | ICD-10-CM | POA: Diagnosis not present

## 2021-12-12 ENCOUNTER — Encounter: Payer: Medicare PPO | Admitting: Osteopathic Medicine

## 2021-12-12 ENCOUNTER — Encounter: Payer: Medicare PPO | Admitting: Medical-Surgical

## 2021-12-21 ENCOUNTER — Other Ambulatory Visit: Payer: Self-pay

## 2021-12-21 ENCOUNTER — Ambulatory Visit (INDEPENDENT_AMBULATORY_CARE_PROVIDER_SITE_OTHER): Payer: Medicare PPO | Admitting: Family Medicine

## 2021-12-21 VITALS — BP 149/63 | HR 64 | Ht 63.5 in | Wt 137.0 lb

## 2021-12-21 DIAGNOSIS — Z0000 Encounter for general adult medical examination without abnormal findings: Secondary | ICD-10-CM

## 2021-12-21 DIAGNOSIS — Z780 Asymptomatic menopausal state: Secondary | ICD-10-CM

## 2021-12-21 DIAGNOSIS — Z1231 Encounter for screening mammogram for malignant neoplasm of breast: Secondary | ICD-10-CM

## 2021-12-21 NOTE — Progress Notes (Signed)
?MEDICARE ANNUAL WELLNESS VISIT ? ?12/21/2021 ? ?Subjective:  ?Heidi Barber is a 76 y.o. female patient of Sunnie Nielsen, DO who had a Medicare Annual Wellness Visit today. Willine is Retired and lives with their spouse. she has 2 children. she reports that she is socially active and does interact with friends/family regularly. she is moderately physically active and enjoys writing and research.. ? ?Patient Care Team: ?Sunnie Nielsen, DO as PCP - General (Osteopathic Medicine) ? ?Advanced Directives 12/21/2021 11/26/2020 11/17/2019  ?Does Patient Have a Medical Advance Directive? Yes Yes Yes  ?Type of Advance Directive Living will Healthcare Power of [place];Living will Healthcare Power of [place];Living will  ?Does patient want to make changes to medical advance directive? No - Patient declined No - Patient declined No - Patient declined  ?Copy of Healthcare Power of Attorney in Chart? - No - copy requested No - copy requested  ? ? ?Hospital Utilization Over the Past 12 Months: ?# of hospitalizations or ER visits: 0 ?# of surgeries: 0 ? ?Review of Systems    ?Patient reports that her overall health is unchanged when compared to last year. ? ?Review of Systems: ?History obtained from chart review and the patient ? ?All other systems negative. ? ?Pain Assessment ?Pain : No/denies pain ? ?  ? ?Current Medications & Allergies (verified) ?Allergies as of 12/21/2021   ? ?   Reactions  ? Codeine Nausea Only  ? Other   ? Hay fever  ? Sulfa Antibiotics Itching, Rash  ? ?  ? ?  ?Medication List  ?  ? ?  ? Accurate as of December 21, 2021 10:43 AM. If you have any questions, ask your nurse or doctor.  ?  ?  ? ?  ? ?alendronate 70 MG tablet ?Commonly known as: FOSAMAX ?Take 1 tablet (70 mg total) by mouth once a week. Take with a full glass of water on an empty stomach. ?  ?aspirin 81 MG EC tablet ?Take 81 mg by mouth daily. Swallow whole. ?  ?Calcium 600+D 600-5 MG-MCG Tabs ?Generic drug: Calcium Carbonate-Vitamin  D ?Take by mouth. ?  ?CoQ10 100 MG Caps ?Take by mouth. ?  ?Minoxidil 5 % Foam ?Apply 1 application topically daily. ?  ?multivitamin-lutein Caps capsule ?Take 1 capsule by mouth daily. Once a day ?  ?simvastatin 40 MG tablet ?Commonly known as: ZOCOR ?Take 1 tablet (40 mg total) by mouth daily. ?  ?Vitamin D3 50 MCG (2000 UT) Tabs ?Take by mouth. ?  ?Vitamin E 180 MG Caps ?Take by mouth. ?  ? ?  ? ? ?History (reviewed): ?Past Medical History:  ?Diagnosis Date  ? Allergy   ? hayfever  ? Cataract   ? High cholesterol   ? Hypertension   ? Maybe?  ? Osteoporosis   ? ?Past Surgical History:  ?Procedure Laterality Date  ? COSMETIC SURGERY    ? corrected a deviated septum  ? HERNIA REPAIR  abt 2012  ? LAPAROSCOPIC INTERNAL HERNIA REPAIR  2015  ? NASAL SEPTUM SURGERY  1987  ? VAGINAL HYSTERECTOMY    ? ?Family History  ?Problem Relation Age of Onset  ? Stroke Mother   ? High blood pressure Father   ? Skin cancer Father   ? Hypertension Father   ? Stroke Maternal Grandmother   ? ?Social History  ? ?Socioeconomic History  ? Marital status: Married  ?  Spouse name: Gerlene Burdock  ? Number of children: 2  ? Years of education: 32  ? Highest  education level: Master's degree (e.g., MA, MS, MEng, MEd, MSW, MBA)  ?Occupational History  ? 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 Smoking status: Never  ? Smokeless tobacco: Never  ?Vaping Use  ? Vaping Use: Never used  ?Substance and Sexual Activity  ? Alcohol use: Yes  ?  Comment: occasionally  ? Drug use: Never  ? Sexual activity: Not Currently  ?  Partners: Male  ?Other Topics Concern  ? Not on file  ?Social History Narrative  ? Retired Runner, broadcasting/film/video. Likes to do writing and research on her computer.   ? ?Social Determinants of Health  ? ?Financial Resource Strain: Low Risk   ? Difficulty of Paying Living Expenses: Not hard at all  ?Food Insecurity: No Food Insecurity  ? Worried About Programme researcher, broadcasting/film/video in the Last Year: Never true  ? Ran Out of Food in the Last Year: Never  true  ?Transportation Needs: No Transportation Needs  ? Lack of Transportation (Medical): No  ? Lack of Transportation (Non-Medical): No  ?Physical Activity: Sufficiently Active  ? Days of Exercise per Week: 7 days  ? Minutes of Exercise per Session: 50 min  ?Stress: No Stress Concern Present  ? Feeling of Stress : Not at all  ?Social Connections: Moderately Integrated  ? Frequency of Communication with Friends and Family: More than three times a week  ? Frequency of Social Gatherings with Friends and Family: More than three times a week  ? Attends Religious Services: Never  ? Active Member of Clubs or Organizations: Yes  ? Attends Banker Meetings: Never  ? Marital Status: Married  ? ? ?Activities of Daily Living ?In your present state of health, do you have any difficulty performing the following activities: 12/21/2021 12/20/2021  ?Hearing? N N  ?Vision? N N  ?Difficulty concentrating or making decisions? N N  ?Walking or climbing stairs? N N  ?Dressing or bathing? N N  ?Doing errands, shopping? N N  ?Preparing Food and eating ? N N  ?Using the Toilet? N N  ?In the past six months, have you accidently leaked urine? N N  ?Do you have problems with loss of bowel control? N N  ?Managing your Medications? N N  ?Managing your Finances? N N  ?Housekeeping or managing your Housekeeping? N N  ?Some recent data might be hidden  ? ? ?Patient Education/Literacy ?How often do you need to have someone help you when you read instructions, pamphlets, or other written materials from your doctor or pharmacy?: 1 - Never ?What is the last grade level you completed in school?: Masters degree ? ?Exercise ?Current Exercise Habits: Home exercise routine, Type of exercise: walking, Time (Minutes): 45, Frequency (Times/Week): 7, Weekly Exercise (Minutes/Week): 315, Intensity: Moderate, Exercise limited by: None identified ? ?Diet ?Patient reports consuming 2 meals a day and 1 snack(s) a day ?Patient reports that her primary  diet is: Regular ?Patient reports that she does have regular access to food.  ? ?Depression Screen ?PHQ 2/9 Scores 12/21/2021 12/07/2020 11/26/2020 12/08/2019 07/14/2019  ?PHQ - 2 Score 0 0 0 0 0  ?PHQ- 9 Score - - 0 2 0  ?  ? ?Fall Risk ?Fall Risk  12/21/2021 12/20/2021 12/07/2020 11/26/2020 12/08/2019  ?Falls in the past year? 0 0 0 0 0  ?Number falls in past yr: 0 - - 0 -  ?Injury with Fall? 0 - - 0 -  ?Risk for fall due to : No Fall Risks - -  No Fall Risks -  ?Follow up Falls evaluation completed - Falls evaluation completed Falls evaluation completed Falls evaluation completed  ? ?  ?Objective:  ? ?BP (!) 149/63 (BP Location: Left Arm, Patient Position: Sitting, Cuff Size: Normal)   Pulse 64   Ht 5' 3.5" (1.613 m)   Wt 137 lb (62.1 kg)   SpO2 99%   BMI 23.89 kg/m?   ?Last Weight  Most recent update: 12/21/2021 10:06 AM  ? ? Weight  ?62.1 kg (137 lb)  ?      ? ?  ?  ?Body mass index is 23.89 kg/m?. ? ?Hearing/Vision  ?Elease Hashimotoatricia did not have difficulty with hearing/understanding during the face-to-face interview ?Elease Hashimotoatricia did not have difficulty with her vision during the face-to-face interview ?Reports that she has not had a formal eye exam by an eye care professional within the past year ?Reports that she has not had a formal hearing evaluation within the past year ? ?Cognitive Function: ?6CIT Screen 12/21/2021 11/26/2020 11/17/2019  ?What Year? 0 points 0 points 0 points  ?What month? 0 points 0 points 0 points  ?What time? 0 points 0 points 0 points  ?Count back from 20 0 points 0 points 0 points  ?Months in reverse 0 points 0 points 0 points  ?Repeat phrase 0 points 0 points 0 points  ?Total Score 0 0 0  ? ? ?Normal Cognitive Function Screening: Yes ?(Normal:0-7, Significant for Dysfunction: >8) ? ?Immunization & Health Maintenance Record ?Immunization History  ?Administered Date(s) Administered  ? Fluad Quad(high Dose 65+) 06/10/2019  ? Influenza-Unspecified 07/08/2014, 07/29/2015, 06/10/2019, 07/08/2020, 06/21/2021  ?  Moderna Covid-19 Vaccine Bivalent Booster 4769yrs & up 07/14/2021  ? Moderna Sars-Covid-2 Vaccination 08/09/2020, 01/06/2021  ? PFIZER(Purple Top)SARS-COV-2 Vaccination 11/04/2019, 12/02/2019  ? Pneumococcal Conjug

## 2021-12-21 NOTE — Patient Instructions (Addendum)
?MEDICARE ANNUAL WELLNESS VISIT ?Health Maintenance Summary and Written Plan of Care ? ?Heidi Barber , ? ?Thank you for allowing me to perform your Medicare Annual Wellness Visit and for your ongoing commitment to your health.  ? ?Health Maintenance & Immunization History ?Health Maintenance  ?Topic Date Due  ? Zoster Vaccines- Shingrix (2 of 2) 03/23/2022 (Originally 01/24/2016)  ? TETANUS/TDAP  03/09/2026  ? COLONOSCOPY (Pts 45-64yrs Insurance coverage will need to be confirmed)  08/06/2028  ? Pneumonia Vaccine 72+ Years old  Completed  ? INFLUENZA VACCINE  Completed  ? DEXA SCAN  Completed  ? COVID-19 Vaccine  Completed  ? Hepatitis C Screening  Completed  ? HPV VACCINES  Aged Out  ? ?Immunization History  ?Administered Date(s) Administered  ? Fluad Quad(high Dose 65+) 06/10/2019  ? Influenza-Unspecified 07/08/2014, 07/29/2015, 06/10/2019, 07/08/2020, 06/21/2021  ? Moderna Covid-19 Vaccine Bivalent Booster 81yrs & up 07/14/2021  ? Moderna Sars-Covid-2 Vaccination 08/09/2020, 01/06/2021  ? PFIZER(Purple Top)SARS-COV-2 Vaccination 11/04/2019, 12/02/2019  ? Pneumococcal Conjugate-13 11/10/2014, 12/07/2014  ? Pneumococcal Polysaccharide-23 01/20/2016  ? Td 06/05/2011  ? Tdap 06/05/2011, 03/09/2016  ? Zoster Recombinat (Shingrix) 11/29/2015  ? ? ?These are the patient goals that we discussed: ? Goals Addressed   ? ?  ?  ?  ?  ?  ? This Visit's Progress  ?   Patient Stated (pt-stated)     ?   Loose 5 lbs. ?  ? ?  ?  ? ?This is a list of Health Maintenance Items that are overdue or due now: ?one densitometry screening ?Shingrix vaccine  ? ?Orders/Referrals Placed Today: ?Orders Placed This Encounter  ?Procedures  ? Mammogram 3D SCREEN BREAST BILATERAL  ?  Standing Status:   Future  ?  Standing Expiration Date:   12/22/2022  ?  Scheduling Instructions:  ?   Please call patient to schedule. Her last one was on 02/09/21.  ?  Order Specific Question:   Reason for Exam (SYMPTOM  OR DIAGNOSIS REQUIRED)  ?  Answer:   Breast  cancer screening  ?  Order Specific Question:   Preferred imaging location?  ?  [facility]   [facility]  ? [facility]  ?  Standing Status:   Future  ?  Standing Expiration Date:   12/22/2022  ?  Scheduling Instructions:  ?   Please call patient to schedule. Her last one was 01/30/20.  ?  Order Specific Question:   Reason for exam:  ?  Answer:   Post Menopausal  ?  Order Specific Question:   Preferred imaging location?  ?  Answer:   [facility]  ? ?(Contact our referral department at [phone] if you have not spoken with someone about your referral appointment within the next 5 days)  ? ? ?Follow-up Plan ?Follow-up with Christen Butter, FNP as planned ?Please let us know about your shingrix vaccine.  ?Referrals have been sent for your mammogram and bone density and they will call you to schedule. ?Medicare wellness visit in one year.  ?AVS printed and given to the patient. ? ?  ?Health Maintenance, Female ?Adopting a healthy lifestyle and getting preventive care are important in promoting health and wellness. Ask your health care provider about: ?The right schedule for you to have regular tests and exams. ?Things you can do on your own to prevent diseases and keep yourself healthy. ?What should I know about diet, weight, and exercise? ?Eat a healthy diet ? ?Eat a diet that includes plenty of vegetables, fruits, low-fat dairy  products, and lean protein. ?Do not eat a lot of foods that are high in solid fats, added sugars, or sodium. ?Maintain a healthy weight ?Body mass index (BMI) is used to identify weight problems. It estimates body fat based on height and weight. Your health care provider can help determine your BMI and help you achieve or maintain a healthy weight. ?Get regular exercise ?Get regular exercise. This is one of the most important things you can do for your health. Most adults should: ?Exercise for at least 150 minutes each week. The exercise should increase your heart rate and make  you sweat (moderate-intensity exercise). ?Do strengthening exercises at least twice a week. This is in addition to the moderate-intensity exercise. ?Spend less time sitting. Even light physical activity can be beneficial. ?Watch cholesterol and blood lipids ?Have your blood tested for lipids and cholesterol at 76 years of age, then have this test every 5 years. ?Have your cholesterol levels checked more often if: ?Your lipid or cholesterol levels are high. ?You are older than 76 years of age. ?You are at high risk for heart disease. ?What should I know about cancer screening? ?Depending on your health history and family history, you may need to have cancer screening at various ages. This may include screening for: ?Breast cancer. ?Cervical cancer. ?Colorectal cancer. ?Skin cancer. ?Lung cancer. ?What should I know about heart disease, diabetes, and high blood pressure? ?Blood pressure and heart disease ?High blood pressure causes heart disease and increases the risk of stroke. This is more likely to develop in people who have high blood pressure readings or are overweight. ?Have your blood pressure checked: ?Every 3-5 years if you are 918-76 years of age. ?Every year if you are 76 years old or older. ?Diabetes ?Have regular diabetes screenings. This checks your fasting blood sugar level. Have the screening done: ?Once every three years after age 76 if you are at a normal weight and have a low risk for diabetes. ?More often and at a younger age if you are overweight or have a high risk for diabetes. ?What should I know about preventing infection? ?Hepatitis B ?If you have a higher risk for hepatitis B, you should be screened for this virus. Talk with your health care provider to find out if you are at risk for hepatitis B infection. ?Hepatitis C ?Testing is recommended for: ?Everyone born from [birth year] through 1965. ?Anyone with known risk factors for hepatitis C. ?Sexually transmitted infections (STIs) ?Get screened for  STIs, including gonorrhea and chlamydia, if: ?You are sexually active and are younger than 76 years of age. ?You are older than 76 years of age and your health care provider tells you that you are at risk for this type of infection. ?Your sexual activity has changed since you were last screened, and you are at increased risk for chlamydia or gonorrhea. Ask your health care provider if you are at risk. ?Ask your health care provider about whether you are at high risk for HIV. Your health care provider may recommend a prescription medicine to help prevent HIV infection. If you choose to take medicine to prevent HIV, you should first get tested for HIV. You should then be tested every 3 months for as long as you are taking the medicine. ?Pregnancy ?If you are about to stop having your period (premenopausal) and you may become pregnant, seek counseling before you get pregnant. ?Take 400 to 800 micrograms (mcg) of folic acid every day if you become pregnant. ?Ask for  birth control (contraception) if you want to prevent pregnancy. ?Osteoporosis and menopause ?Osteoporosis is a disease in which the bones lose minerals and strength with aging. This can result in bone fractures. If you are 28 years old or older, or if you are at risk for osteoporosis and fractures, ask your health care provider if you should: ?Be screened for bone loss. ?Take a calcium or vitamin D supplement to lower your risk of fractures. ?Be given hormone replacement therapy (HRT) to treat symptoms of menopause. ?Follow these instructions at home: ?Alcohol use ?Do not drink alcohol if: ?Your health care provider tells you not to drink. ?You are pregnant, may be pregnant, or are planning to become pregnant. ?If you drink alcohol: ?Limit how much you have to: ?0-1 drink a day. ?Know how much alcohol is in your drink. In the [place] one drink equals one 12 oz bottle of beer (355 mL), one 5 oz glass of wine (148 mL), or one 1? oz glass of hard liquor (44  mL). ?Lifestyle ?Do not use any products that contain nicotine or tobacco. These products include cigarettes, chewing tobacco, and vaping devices, such as e-cigarettes. If you need help quitting, ask your health

## 2022-02-01 ENCOUNTER — Ambulatory Visit (INDEPENDENT_AMBULATORY_CARE_PROVIDER_SITE_OTHER): Payer: Medicare PPO

## 2022-02-01 DIAGNOSIS — M810 Age-related osteoporosis without current pathological fracture: Secondary | ICD-10-CM

## 2022-02-01 DIAGNOSIS — Z780 Asymptomatic menopausal state: Secondary | ICD-10-CM | POA: Diagnosis not present

## 2022-02-01 DIAGNOSIS — Z0000 Encounter for general adult medical examination without abnormal findings: Secondary | ICD-10-CM

## 2022-02-07 ENCOUNTER — Encounter: Payer: Self-pay | Admitting: Medical-Surgical

## 2022-02-07 ENCOUNTER — Ambulatory Visit (INDEPENDENT_AMBULATORY_CARE_PROVIDER_SITE_OTHER): Payer: Medicare PPO | Admitting: Medical-Surgical

## 2022-02-07 VITALS — BP 156/83 | HR 63 | Resp 20 | Ht 63.5 in | Wt 138.4 lb

## 2022-02-07 DIAGNOSIS — Z0000 Encounter for general adult medical examination without abnormal findings: Secondary | ICD-10-CM | POA: Diagnosis not present

## 2022-02-07 DIAGNOSIS — E559 Vitamin D deficiency, unspecified: Secondary | ICD-10-CM

## 2022-02-07 DIAGNOSIS — E782 Mixed hyperlipidemia: Secondary | ICD-10-CM

## 2022-02-07 MED ORDER — SIMVASTATIN 40 MG PO TABS: 40.0000 mg | ORAL_TABLET | Freq: Every day | ORAL | 3 refills | Status: DC

## 2022-02-07 MED ORDER — ALENDRONATE SODIUM 70 MG PO TABS: 70.0000 mg | ORAL_TABLET | ORAL | 3 refills | Status: DC

## 2022-02-07 NOTE — Progress Notes (Signed)
HPI: ?Heidi Barber is a 76 y.o. female who  has a past medical history of Allergy, Cataract, High cholesterol, Hypertension, and Osteoporosis. ? ?she presents to [facility] today, 02/07/22,  ?for chief complaint of: ?Annual physical exam ?Transfer of care ? ?Dentist: UTD, having a bridge put in ?Eye exam: UTD, wears glasses ?Exercise: walking daily ?Diet: avoids red meats, regular milk ?Pap smear: aged out ?Mammogram: scheduled ?Colon cancer screening: did cologuard ?COVID vaccine: done, boosters done ? ?Concerns: ?Needs refills on Simvastatin and Fosamax ? ?Past medical, surgical, social and family history reviewed: ? ?Patient Active Problem List  ? Diagnosis Date Noted  ? Age-related osteoporosis without current pathological fracture 07/14/2019  ? Mixed hyperlipidemia 07/14/2019  ? Vitamin D deficiency 08/22/2017  ? ? ?Past Surgical History:  ?Procedure Laterality Date  ? COSMETIC SURGERY    ? corrected a deviated septum  ? HERNIA REPAIR  abt 2012  ? LAPAROSCOPIC INTERNAL HERNIA REPAIR  2015  ? NASAL SEPTUM SURGERY  1987  ? VAGINAL HYSTERECTOMY    ? ? ?Social History  ? ?Tobacco Use  ? Smoking status: Never  ? Smokeless tobacco: Never  ?Substance Use Topics  ? Alcohol use: Yes  ?  Comment: occasionally  ? ? ?Family History  ?Problem Relation Age of Onset  ? Stroke Mother   ? High blood pressure Father   ? Skin cancer Father   ? Hypertension Father   ? Stroke Maternal Grandmother   ?  ? ?Current medication list and allergy/intolerance information reviewed:   ? ?Current Outpatient Medications  ?Medication Sig Dispense Refill  ? aspirin 81 MG EC tablet Take 81 mg by mouth daily. Swallow whole.    ? Calcium Carbonate-Vitamin D (CALCIUM 600+D) 600-200 MG-UNIT TABS Take by mouth.    ? Cholecalciferol (VITAMIN D3) 50 MCG (2000 UT) TABS Take by mouth.    ? Coenzyme Q10 (COQ10) 100 MG CAPS Take by mouth.    ? multivitamin-lutein (OCUVITE-LUTEIN) CAPS capsule Take 1 capsule by  mouth daily. Once a day    ? Vitamin E 180 MG CAPS Take by mouth.    ? alendronate (FOSAMAX) 70 MG tablet Take 1 tablet (70 mg total) by mouth once a week. Take with a full glass of water on an empty stomach. 12 tablet 3  ? simvastatin (ZOCOR) 40 MG tablet Take 1 tablet (40 mg total) by mouth daily. 90 tablet 3  ? ?No current facility-administered medications for this visit.  ? ? ?Allergies  ?Allergen Reactions  ? Codeine Nausea Only  ? Other   ?  Hay fever  ? Sulfa Antibiotics Itching and Rash  ? ?  ? ?Review of Systems: ?Constitutional:  No  fever, no chills, No recent illness, No unintentional weight changes. No significant fatigue.  ?HEENT: No  headache, no vision change, no hearing change, No sore throat, No  sinus pressure ?Cardiac: No  chest pain, No  pressure, No palpitations, No  Orthopnea ?Respiratory:  No  shortness of breath. No  Cough ?Gastrointestinal: No  abdominal pain, No  nausea, No  vomiting,  No  blood in stool, No  diarrhea, No  constipation  ?Musculoskeletal: No new myalgia/arthralgia ?Skin: No  Rash, No other wounds/concerning lesions ?Genitourinary: No  incontinence, No  abnormal genital bleeding, No abnormal genital discharge ?Hem/Onc: No  easy bruising/bleeding, No  abnormal lymph node ?Endocrine: No cold intolerance,  No heat intolerance. No polyuria/polydipsia/polyphagia  ?Neurologic: No  weakness, No  dizziness, No  slurred speech/focal weakness/facial droop ?Psychiatric: No  concerns with depression, No  concerns with anxiety, No sleep problems, No mood problems ? ?Exam:  ?BP (!) 156/83   Pulse 63   Resp 20   Ht 5' 3.5" (1.613 m)   Wt 138 lb 6.4 oz (62.8 kg)   SpO2 99%   BMI 24.13 kg/m?  ?Constitutional: VS see above. General Appearance: alert, well-developed, well-nourished, NAD ?Eyes: Normal lids and conjunctive, non-icteric sclera ?Ears, Nose, Mouth, Throat: MMM, Normal external inspection ears/nares/mouth/lips/gums. TM normal bilaterally. Pharynx/tonsils no erythema, no  exudate. Nasal mucosa normal.  ?Neck: No masses, trachea midline. No thyroid enlargement. No tenderness/mass appreciated. No lymphadenopathy ?Respiratory: Normal respiratory effort. no wheeze, no rhonchi, no rales ?Cardiovascular: S1/S2 normal, no murmur, no rub/gallop auscultated. RRR. No lower extremity edema. Pedal pulse II/IV bilaterally DP and PT. No carotid bruit or JVD. No abdominal aortic bruit. ?Gastrointestinal: Nontender, no masses. No hepatomegaly, no splenomegaly. No hernia appreciated. Bowel sounds normal. Rectal exam deferred.  ?Musculoskeletal: Gait normal. No clubbing/cyanosis of digits.  ?Neurological: Normal balance/coordination. No tremor. No cranial nerve deficit on limited exam. Motor and sensation intact and symmetric. Cerebellar reflexes intact.  ?Skin: warm, dry, intact. No rash/ulcer. No concerning nevi or subq nodules on limited exam.   ?Psychiatric: Normal judgment/insight. Normal mood and affect. Oriented x3.  ? ? ?ASSESSMENT/PLAN:  ? ?1. Annual physical exam ?Checking labs as below.  Up-to-date on preventative care.  Wellness information provided with AVS. ?- CBC with Differential/Platelet ?- COMPLETE METABOLIC PANEL WITH GFR ?- Lipid panel ? ?2. Mixed hyperlipidemia ?Checking lipid panel today.  Refilling simvastatin. ?- Lipid panel ? ?3. Vitamin D deficiency ?Checking vitamin D today.  Refilling Fosamax ?- VITAMIN D 25 Hydroxy (Vit-D Deficiency, Fractures) ? ? ?Orders Placed This Encounter  ?Procedures  ? CBC with Differential/Platelet  ? COMPLETE METABOLIC PANEL WITH GFR  ? Lipid panel  ? VITAMIN D 25 Hydroxy (Vit-D Deficiency, Fractures)  ? ? ?Meds ordered this encounter  ?Medications  ? alendronate (FOSAMAX) 70 MG tablet  ?  Sig: Take 1 tablet (70 mg total) by mouth once a week. Take with a full glass of water on an empty stomach.  ?  Dispense:  12 tablet  ?  Refill:  3  ? simvastatin (ZOCOR) 40 MG tablet  ?  Sig: Take 1 tablet (40 mg total) by mouth daily.  ?  Dispense:  90 tablet   ?  Refill:  3  ? ? ?There are no Patient Instructions on file for this visit. ? ?Follow-up plan: Return in about 1 year (around 02/08/2023) for annual physical exam. ? ?Thayer Ohm, DNP, APRN, FNP-BC ?[facility] ?Primary Care and Sports Medicine ?

## 2022-02-09 DIAGNOSIS — Z0000 Encounter for general adult medical examination without abnormal findings: Secondary | ICD-10-CM | POA: Diagnosis not present

## 2022-02-09 DIAGNOSIS — E782 Mixed hyperlipidemia: Secondary | ICD-10-CM | POA: Diagnosis not present

## 2022-02-09 DIAGNOSIS — E559 Vitamin D deficiency, unspecified: Secondary | ICD-10-CM | POA: Diagnosis not present

## 2022-02-10 LAB — COMPLETE METABOLIC PANEL WITH GFR
AG Ratio: 1.5 (calc) (ref 1.0–2.5)
ALT: 17 U/L (ref 6–29)
AST: 20 U/L (ref 10–35)
Albumin: 4.2 g/dL (ref 3.6–5.1)
Alkaline phosphatase (APISO): 89 U/L (ref 37–153)
BUN: 13 mg/dL (ref 7–25)
CO2: 25 mmol/L (ref 20–32)
Calcium: 9.5 mg/dL (ref 8.6–10.4)
Chloride: 103 mmol/L (ref 98–110)
Creat: 0.73 mg/dL (ref 0.60–1.00)
Globulin: 2.8 g/dL (calc) (ref 1.9–3.7)
Glucose, Bld: 97 mg/dL (ref 65–99)
Potassium: 4.3 mmol/L (ref 3.5–5.3)
Sodium: 137 mmol/L (ref 135–146)
Total Bilirubin: 0.6 mg/dL (ref 0.2–1.2)
Total Protein: 7 g/dL (ref 6.1–8.1)
eGFR: 86 mL/min/{1.73_m2} (ref >=60)

## 2022-02-10 LAB — CBC WITH DIFFERENTIAL/PLATELET
Absolute Monocytes: 540 cells/uL (ref 200–950)
Basophils Absolute: 36 cells/uL (ref 0–200)
Basophils Relative: 0.4 %
Eosinophils Absolute: 63 cells/uL (ref 15–500)
Eosinophils Relative: 0.7 %
HCT: 40.6 % (ref 35.0–45.0)
Hemoglobin: 13 g/dL (ref 11.7–15.5)
Lymphs Abs: 1089 cells/uL (ref 850–3900)
MCH: 28.8 pg (ref 27.0–33.0)
MCHC: 32 g/dL (ref 32.0–36.0)
MCV: 89.8 fL (ref 80.0–100.0)
MPV: 10.3 fL (ref 7.5–12.5)
Monocytes Relative: 6 %
Neutro Abs: 7272 cells/uL (ref 1500–7800)
Neutrophils Relative %: 80.8 %
Platelets: 270 10*3/uL (ref 140–400)
RBC: 4.52 10*6/uL (ref 3.80–5.10)
RDW: 14.3 % (ref 11.0–15.0)
Total Lymphocyte: 12.1 %
WBC: 9 10*3/uL (ref 3.8–10.8)

## 2022-02-10 LAB — LIPID PANEL
Cholesterol: 175 mg/dL (ref <200)
HDL: 68 mg/dL (ref >=50)
LDL Cholesterol (Calc): 91 mg/dL (calc)
Non-HDL Cholesterol (Calc): 107 mg/dL (calc) (ref <130)
Total CHOL/HDL Ratio: 2.6 (calc) (ref <5.0)
Triglycerides: 70 mg/dL (ref <150)

## 2022-02-10 LAB — VITAMIN D 25 HYDROXY (VIT D DEFICIENCY, FRACTURES): Vit D, 25-Hydroxy: 27 ng/mL — ABNORMAL LOW (ref 30–100)

## 2022-02-15 ENCOUNTER — Encounter: Payer: Self-pay | Admitting: Medical-Surgical

## 2022-02-16 ENCOUNTER — Ambulatory Visit (INDEPENDENT_AMBULATORY_CARE_PROVIDER_SITE_OTHER): Payer: Medicare PPO

## 2022-02-16 DIAGNOSIS — Z1231 Encounter for screening mammogram for malignant neoplasm of breast: Secondary | ICD-10-CM

## 2022-02-16 DIAGNOSIS — Z0000 Encounter for general adult medical examination without abnormal findings: Secondary | ICD-10-CM

## 2022-02-16 NOTE — Progress Notes (Signed)
Please call patient. Normal mammogram.  Repeat in 1 year.  

## 2022-03-01 ENCOUNTER — Ambulatory Visit (INDEPENDENT_AMBULATORY_CARE_PROVIDER_SITE_OTHER): Payer: Medicare PPO | Admitting: Medical-Surgical

## 2022-03-01 VITALS — BP 157/80 | HR 69 | Ht 63.5 in | Wt 136.0 lb

## 2022-03-01 DIAGNOSIS — M810 Age-related osteoporosis without current pathological fracture: Secondary | ICD-10-CM

## 2022-03-01 MED ORDER — DENOSUMAB 60 MG/ML ~~LOC~~ SOSY
60.0000 mg | PREFILLED_SYRINGE | Freq: Once | SUBCUTANEOUS | Status: AC
  Administered 2022-03-01: 60 mg via SUBCUTANEOUS

## 2022-03-01 NOTE — Patient Instructions (Signed)
Have labs done one week before next injection

## 2022-03-01 NOTE — Progress Notes (Signed)
Agree with documentation as below.  ___________________________________________ Letti Towell L. Roshunda Keir, DNP, APRN, FNP-BC Primary Care and Sports Medicine [facility]  

## 2022-03-01 NOTE — Progress Notes (Signed)
Patient is here for Prolia injection. Verified no previous problems with injection. She has been off them for awhile, taking Fosamax, but has chosen to go back on Prolia.   Prolia injection to left arm with no apparent complications. Patient advised to call with any problems. Patient advised to schedule next injection in 6 months. Labs ordered for patient to have done one week prior to this appointment. Patient advised on need to have these completed. She also asked me to call pharmacy to cancel her Fosamax RX and this has been completed as well.

## 2022-07-28 ENCOUNTER — Encounter: Payer: Self-pay | Admitting: Medical-Surgical

## 2022-08-28 DIAGNOSIS — M810 Age-related osteoporosis without current pathological fracture: Secondary | ICD-10-CM | POA: Diagnosis not present

## 2022-08-29 ENCOUNTER — Telehealth: Payer: Self-pay

## 2022-08-29 LAB — COMPLETE METABOLIC PANEL WITH GFR
AG Ratio: 1.3 (calc) (ref 1.0–2.5)
ALT: 15 U/L (ref 6–29)
AST: 17 U/L (ref 10–35)
Albumin: 4 g/dL (ref 3.6–5.1)
Alkaline phosphatase (APISO): 82 U/L (ref 37–153)
BUN: 9 mg/dL (ref 7–25)
CO2: 27 mmol/L (ref 20–32)
Calcium: 8.7 mg/dL (ref 8.6–10.4)
Chloride: 105 mmol/L (ref 98–110)
Creat: 0.66 mg/dL (ref 0.60–1.00)
Globulin: 3.1 g/dL (calc) (ref 1.9–3.7)
Glucose, Bld: 131 mg/dL — ABNORMAL HIGH (ref 65–99)
Potassium: 4.1 mmol/L (ref 3.5–5.3)
Sodium: 141 mmol/L (ref 135–146)
Total Bilirubin: 0.4 mg/dL (ref 0.2–1.2)
Total Protein: 7.1 g/dL (ref 6.1–8.1)
eGFR: 91 mL/min/{1.73_m2} (ref >=60)

## 2022-08-29 NOTE — Telephone Encounter (Signed)
Per Covermymeds, PA is not required for prolia under patient's plan. Screennshot scanned into chart.

## 2022-09-04 ENCOUNTER — Ambulatory Visit (INDEPENDENT_AMBULATORY_CARE_PROVIDER_SITE_OTHER): Payer: Medicare PPO | Admitting: Medical-Surgical

## 2022-09-04 VITALS — BP 142/88 | HR 68

## 2022-09-04 DIAGNOSIS — M810 Age-related osteoporosis without current pathological fracture: Secondary | ICD-10-CM

## 2022-09-04 MED ORDER — DENOSUMAB 60 MG/ML ~~LOC~~ SOSY
60.0000 mg | PREFILLED_SYRINGE | Freq: Once | SUBCUTANEOUS | Status: AC
  Administered 2022-09-04: 60 mg via SUBCUTANEOUS

## 2022-09-04 NOTE — Progress Notes (Signed)
   Established Patient Office Visit  Subjective   Patient ID: Heidi Barber, female    DOB: July 05, 1946  Age: 76 y.o. MRN: [id number]  Chief Complaint  Patient presents with   Osteoporosis    HPI  KAJAL SCALICI is here for Prolia injection. She is taking calcium and vitamin D daily.  Blood pressure is elevated today. Denies chest pain, shortness of breath or dizziness.   Home readings 155/85 152/83 154/95 164/88 159/75  ROS    Objective:     BP (!) 173/79   Pulse 68   SpO2 97%    Physical Exam   No results found for any visits on 09/04/22.    The 10-year ASCVD risk score (Arnett DK, et al., 2019) is: 29.9%    Assessment & Plan:  Osteoporosis - Patient tolerated injection well without complications. Patient advised to schedule next injection 6 months and 1 day from today.    Blood pressure elevated. Advised to come back in 2 weeks for blood pressure check.   Problem List Items Addressed This Visit       Unprioritized   Age-related osteoporosis without current pathological fracture - Primary    Return in about 6 months (around 03/06/2023) for Prolia injection. Earna Coder, Janalyn Harder, CMA

## 2022-09-04 NOTE — Progress Notes (Signed)
Agree with documentation as below.  ___________________________________________ Trever Streater L. Idabelle Mcpeters, DNP, APRN, FNP-BC Primary Care and Sports Medicine [facility]  

## 2022-09-18 ENCOUNTER — Ambulatory Visit: Payer: Medicare PPO | Admitting: Medical-Surgical

## 2022-09-18 ENCOUNTER — Encounter: Payer: Self-pay | Admitting: Medical-Surgical

## 2022-09-18 VITALS — BP 135/76 | HR 78 | Resp 20 | Ht 63.5 in | Wt 137.1 lb

## 2022-09-18 DIAGNOSIS — I10 Essential (primary) hypertension: Secondary | ICD-10-CM | POA: Diagnosis not present

## 2022-09-18 MED ORDER — HYDROCHLOROTHIAZIDE 12.5 MG PO TABS: 12.5000 mg | ORAL_TABLET | Freq: Every day | ORAL | 3 refills | Status: DC

## 2022-09-18 NOTE — Progress Notes (Signed)
Established Patient Office Visit  Subjective   Patient ID: Heidi Barber, female   DOB: Sep 08, 1946 Age: 76 y.o. MRN: [id number]   Chief Complaint  Patient presents with   Follow-up   Hypertension   HPI Pleasant 76 year old female presenting today to follow-up on hypertension.  Has been monitoring blood pressures at home with readings from 121-164/73-95 and pulse rates from 61-75.  Not currently taking any medication for management of hypertension but notes that she does have a strong family history of blood pressure and cholesterol issues.  Only on 1 prescribed medication and reports that she does not want to take anything sure that she does not absolutely have to.  She is open to medications if needed.  Denies chest pain, shortness of breath, palpitations, headaches, dizziness, vision changes, and lower extremity edema.   Objective:    Vitals:   09/18/22 1059  BP: 135/76  Pulse: 78  Resp: 20  Height: 5' 3.5" (1.613 m)  Weight: 137 lb 1.6 oz (62.2 kg)  SpO2: 98%  BMI (Calculated): 23.9    Physical Exam Vitals and nursing note reviewed.  Constitutional:      General: She is not in acute distress.    Appearance: Normal appearance. She is not ill-appearing.  HENT:     Head: Normocephalic and atraumatic.  Cardiovascular:     Rate and Rhythm: Normal rate and regular rhythm.     Pulses: Normal pulses.     Heart sounds: Normal heart sounds.  Pulmonary:     Effort: Pulmonary effort is normal. No respiratory distress.     Breath sounds: Normal breath sounds. No wheezing, rhonchi or rales.  Skin:    General: Skin is warm and dry.  Neurological:     Mental Status: She is alert and oriented to person, place, and time.  Psychiatric:        Mood and Affect: Mood normal.        Behavior: Behavior normal.        Thought Content: Thought content normal.        Judgment: Judgment normal.   No results found for this or any previous visit (from the past 24 hour(s)).     The  10-year ASCVD risk score (Arnett DK, et al., 2019) is: 25.7%   Values used to calculate the score:     Age: 57 years     Sex: Female     Is Non-Hispanic African American: No     Diabetic: No     Tobacco smoker: No     Systolic Blood Pressure: 135 mmHg     Is BP treated: Yes     HDL Cholesterol: 68 mg/dL     Total Cholesterol: 175 mg/dL   Assessment & Plan:   1. Essential hypertension Blood pressure in our office today is not too bad at 135/76 but home readings have been consistently higher.  Discussed various options.  She does follow a fairly low sodium diet and is regularly physically active.  After discussion, she is agreeable to starting a low-dose HCTZ 12.5 mg daily.  Advised to continue monitoring blood pressure at home with a goal of 130/80 or less.  Monitor for any dizziness or lightheadedness while on the medication and let us know immediately if this happens.  Continue low-sodium diet and regular physical activity.  Return in about 3 weeks (around 10/09/2022) for nurse visit for BP check.  ___________________________________________ Thayer Ohm, DNP, APRN, FNP-BC Primary Care and Sports Medicine [facility]

## 2022-10-10 ENCOUNTER — Ambulatory Visit (INDEPENDENT_AMBULATORY_CARE_PROVIDER_SITE_OTHER): Payer: Medicare PPO | Admitting: Medical-Surgical

## 2022-10-10 VITALS — BP 148/64 | HR 62 | Ht 63.5 in | Wt 137.0 lb

## 2022-10-10 DIAGNOSIS — I10 Essential (primary) hypertension: Secondary | ICD-10-CM

## 2022-10-10 NOTE — Progress Notes (Signed)
   Subjective:    Patient ID: Heidi Barber, female    DOB: 26-Oct-1945, 77 y.o.   MRN: [id number]  HPI Pt here for bp check, pt denies any headache, blurred vision or problems with medication.   Review of Systems     Objective:   Physical Exam        Assessment & Plan:   Pt advised to continue home monitoring and follow up in 3 months.

## 2022-10-10 NOTE — Progress Notes (Deleted)
   Established Patient Office Visit  Subjective   Patient ID: Heidi Barber, female   DOB: 1946/05/22 Age: 77 y.o. MRN: [id number]   No chief complaint on file.  HPI    Objective:    There were no vitals filed for this visit.  Physical Exam   No results found for this or any previous visit (from the past 24 hour(s)).   {Labs (Optional):23779}  The 10-year ASCVD risk score (Arnett DK, et al., 2019) is: 25.7%   Values used to calculate the score:     Age: 55 years     Sex: Female     Is Non-Hispanic African American: No     Diabetic: No     Tobacco smoker: No     Systolic Blood Pressure: 865 mmHg     Is BP treated: Yes     HDL Cholesterol: 68 mg/dL     Total Cholesterol: 175 mg/dL   Assessment & Plan:   No problem-specific Assessment & Plan notes found for this encounter.   No follow-ups on file.  ___________________________________________ Clearnce Sorrel, DNP, APRN, FNP-BC Primary Care and [place]

## 2022-10-10 NOTE — Progress Notes (Signed)
Agree with documentation as below.  ___________________________________________ Shawnn Bouillon L. Medora Roorda, DNP, APRN, FNP-BC Primary Care and Sports Medicine [facility]  

## 2022-11-13 DIAGNOSIS — H524 Presbyopia: Secondary | ICD-10-CM | POA: Diagnosis not present

## 2022-11-13 DIAGNOSIS — H2513 Age-related nuclear cataract, bilateral: Secondary | ICD-10-CM | POA: Diagnosis not present

## 2022-12-05 ENCOUNTER — Encounter: Payer: Self-pay | Admitting: Medical-Surgical

## 2022-12-05 ENCOUNTER — Other Ambulatory Visit: Payer: Self-pay | Admitting: Medical-Surgical

## 2022-12-06 MED ORDER — HYDROCHLOROTHIAZIDE 12.5 MG PO TABS: 12.5000 mg | ORAL_TABLET | Freq: Every day | ORAL | 3 refills | Status: DC

## 2022-12-28 ENCOUNTER — Ambulatory Visit (INDEPENDENT_AMBULATORY_CARE_PROVIDER_SITE_OTHER): Payer: Medicare PPO | Admitting: Family Medicine

## 2022-12-28 DIAGNOSIS — Z0000 Encounter for general adult medical examination without abnormal findings: Secondary | ICD-10-CM

## 2022-12-28 NOTE — Patient Instructions (Signed)

## 2022-12-28 NOTE — Progress Notes (Signed)
MEDICARE ANNUAL WELLNESS VISIT  12/28/2022  Telephone Visit Disclaimer This Medicare AWV was conducted by telephone due to national recommendations for restrictions regarding the COVID-19 Pandemic (e.g. social distancing).  I verified, using two identifiers, that I am speaking with Heidi Barber or their authorized healthcare agent. I discussed the limitations, risks, security, and privacy concerns of performing an evaluation and management service by telephone and the potential availability of an in-person appointment in the future. The patient expressed understanding and agreed to proceed.  Location of Patient: Home Location of Provider (nurse):  In the phone  Subjective:    Heidi Barber is a 77 y.o. female patient of Samuel Bouche, NP who had a Medicare Annual Wellness Visit today via telephone. Heidi Barber is Retired and lives with their spouse. she has 2 children. she reports that she is socially active and does interact with friends/family regularly. she is moderately physically active and enjoys writing, gardening and research on the her computer.  Patient Care Team: Samuel Bouche, NP as PCP - General (Nurse Practitioner)     12/28/2022   10:53 AM 12/21/2021   10:11 AM 11/26/2020    9:09 AM 11/17/2019    8:55 AM  Advanced Directives  Does Patient Have a Medical Advance Directive? Yes Yes Yes Yes  Type of Advance Directive Living will Living will [place];Living will [place];Living will  Does patient want to make changes to medical advance directive? No - Patient declined No - Patient declined No - Patient declined No - Patient declined  Copy of [place] in Chart?   No - copy requested No - copy requested    Hospital Utilization Over the Past 12 Months: # of hospitalizations or ER visits: 0 # of surgeries: 0  Review of Systems    Patient reports that her overall health is unchanged compared to last  year.  History obtained from chart review and the patient  Patient Reported Readings (BP, Pulse, CBG, Weight, etc) none  Pain Assessment Pain : No/denies pain     Current Medications & Allergies (verified) Allergies as of 12/28/2022       Reactions   Codeine Nausea Only   Other    Hay fever   Sulfa Antibiotics Itching, Rash        Medication List        Accurate as of December 28, 2022 11:04 AM. If you have any questions, ask your nurse or doctor.          aspirin EC 81 MG tablet Take 81 mg by mouth daily. Swallow whole.   Calcium 600+D 600-5 MG-MCG Tabs Generic drug: Calcium Carbonate-Vitamin D Take by mouth.   CoQ10 100 MG Caps Take by mouth.   hydrochlorothiazide 12.5 MG tablet Commonly known as: HYDRODIURIL Take 1 tablet (12.5 mg total) by mouth daily.   multivitamin-lutein Caps capsule Take 1 capsule by mouth daily. Once a day   simvastatin 40 MG tablet Commonly known as: ZOCOR Take 1 tablet (40 mg total) by mouth daily.   Vitamin D3 50 MCG (2000 UT) Tabs Generic drug: Cholecalciferol Take by mouth.   Vitamin E 180 MG Caps Take by mouth.        History (reviewed): Past Medical History:  Diagnosis Date   Allergy    hayfever   Cataract    High cholesterol    Hypertension    Maybe?   Osteoporosis    Past Surgical History:  Procedure  Laterality Date   COSMETIC SURGERY     corrected a deviated septum   HERNIA REPAIR  abt 2012   LAPAROSCOPIC INTERNAL HERNIA REPAIR  2015   NASAL SEPTUM SURGERY  1987   VAGINAL HYSTERECTOMY     Family History  Problem Relation Age of Onset   Stroke Mother    High blood pressure Father    Skin cancer Father    Hypertension Father    Stroke Maternal Grandmother    Social History   Socioeconomic History   Marital status: Married    Spouse name: Richard   Number of children: 2   Years of education: 18   Highest education level: Conservator, museum/gallery (e.g., MA, MS, MEng, MEd, MSW, MBA)  Occupational  History   Occupation: Retired    Comment: Pharmacist, hospital  Tobacco Use   Smoking status: Never   Smokeless tobacco: Never  Vaping Use   Vaping Use: Never used  Substance and Sexual Activity   Alcohol use: Yes    Alcohol/week: 1.0 standard drink of alcohol    Types: 1 Glasses of wine per week    Comment: occasionally   Drug use: Never   Sexual activity: Not Currently    Partners: Male  Other Topics Concern   Not on file  Social History Narrative   Retired Pharmacist, hospital. Likes to do writing and research on her computer.    Social Determinants of Health   Financial Resource Strain: Low Risk  (12/27/2022)   Overall Financial Resource Strain (CARDIA)    Difficulty of Paying Living Expenses: Not hard at all  Food Insecurity: No Food Insecurity (12/27/2022)   Hunger Vital Sign    Worried About Running Out of Food in the Last Year: Never true    Ran Out of Food in the Last Year: Never true  Transportation Needs: No Transportation Needs (12/27/2022)   PRAPARE - Hydrologist (Medical): No    Lack of Transportation (Non-Medical): No  Physical Activity: Sufficiently Active (12/27/2022)   Exercise Vital Sign    Days of Exercise per Week: 6 days    Minutes of Exercise per Session: 40 min  Stress: No Stress Concern Present (12/27/2022)   [place]    Feeling of Stress : Only a little  Social Connections: Moderately Integrated (12/28/2022)   Social Connection and Isolation Panel [NHANES]    Frequency of Communication with Friends and Family: More than three times a week    Frequency of Social Gatherings with Friends and Family: More than three times a week    Attends Religious Services: Never    Marine scientist or Organizations: Yes    Attends Archivist Meetings: Never    Marital Status: Married    Activities of Daily Living    12/27/2022   11:01 AM  In your present state of health, do  you have any difficulty performing the following activities:  Hearing? 0  Vision? 0  Difficulty concentrating or making decisions? 0  Walking or climbing stairs? 0  Dressing or bathing? 0  Doing errands, shopping? 0  Preparing Food and eating ? N  Using the Toilet? N  In the past six months, have you accidently leaked urine? N  Do you have problems with loss of bowel control? N  Managing your Medications? N  Managing your Finances? N  Housekeeping or managing your Housekeeping? N    Patient Education/ Literacy How often do  you need to have someone help you when you read instructions, pamphlets, or other written materials from your doctor or pharmacy?: 1 - Never What is the last grade level you completed in school?: Masters degree  Exercise Current Exercise Habits: Home exercise routine, Type of exercise: walking, Time (Minutes): 40, Frequency (Times/Week): 6, Weekly Exercise (Minutes/Week): 240, Intensity: Moderate, Exercise limited by: None identified  Diet Patient reports consuming 2 meals a day and 1 snack(s) a day Patient reports that her primary diet is: Regular Patient reports that she does have regular access to food.   Depression Screen    12/28/2022   10:55 AM 02/07/2022    2:44 PM 12/21/2021   10:11 AM 12/07/2020    9:08 AM 11/26/2020    9:10 AM 12/08/2019    1:18 PM 07/14/2019   11:33 AM  PHQ 2/9 Scores  PHQ - 2 Score 0 0 0 0 0 0 0  PHQ- 9 Score     0 2 0     Fall Risk    12/28/2022   10:55 AM 12/27/2022   11:01 AM 02/07/2022    2:45 PM 12/21/2021   10:11 AM 12/20/2021   11:17 AM  Fall Risk   Falls in the past year? 0 0 0 0 0  Number falls in past yr: 0 0 0 0   Injury with Fall? 0 0 0 0   Risk for fall due to : No Fall Risks  No Fall Risks No Fall Risks   Follow up Falls evaluation completed  Falls evaluation completed Falls evaluation completed      Objective:  INDU GOYETTE seemed alert and oriented and she participated appropriately during our telephone  visit.  Blood Pressure Weight BMI  BP Readings from Last 3 Encounters:  10/10/22 (!) 148/64  09/18/22 135/76  09/04/22 (!) 142/88   Wt Readings from Last 3 Encounters:  10/10/22 137 lb (62.1 kg)  09/18/22 137 lb 1.6 oz (62.2 kg)  03/01/22 136 lb (61.7 kg)   BMI Readings from Last 1 Encounters:  10/10/22 23.89 kg/m    *Unable to obtain current vital signs, weight, and BMI due to telephone visit type  Hearing/Vision  Dorella did not seem to have difficulty with hearing/understanding during the telephone conversation Reports that she has had a formal eye exam by an eye care professional within the past year Reports that she has not had a formal hearing evaluation within the past year *Unable to fully assess hearing and vision during telephone visit type  Cognitive Function:    12/28/2022   10:57 AM 12/21/2021   10:19 AM 11/26/2020    9:18 AM 11/17/2019    8:58 AM  6CIT Screen  What Year? 0 points 0 points 0 points 0 points  What month? 0 points 0 points 0 points 0 points  What time? 0 points 0 points 0 points 0 points  Count back from 20 0 points 0 points 0 points 0 points  Months in reverse 0 points 0 points 0 points 0 points  Repeat phrase 0 points 0 points 0 points 0 points  Total Score 0 points 0 points 0 points 0 points   (Normal:0-7, Significant for Dysfunction: >8)  Normal Cognitive Function Screening: Yes   Immunization & Health Maintenance Record Immunization History  Administered Date(s) Administered   COVID-19, mRNA, vaccine(Comirnaty)12 years and older 07/26/2022   DTaP 04/24/2016   Fluad Quad(high Dose 65+) 06/10/2019   Influenza-Unspecified 07/08/2014, 07/29/2015, 06/10/2019, 07/08/2020, 06/21/2021, 06/09/2022  MODERNA COVID-19 SARS-COV-2 PEDS BIVALENT BOOSTER 6Y-11Y 07/14/2021   Moderna Covid-19 Vaccine Bivalent Booster 63yrs & up 07/14/2021   Moderna Sars-Covid-2 Vaccination 11/04/2019, 12/02/2019, 08/09/2020, 01/06/2021, 02/08/2022   PFIZER(Purple  Top)SARS-COV-2 Vaccination 11/04/2019, 12/02/2019   Pneumococcal Conjugate-13 11/10/2014, 12/07/2014   Pneumococcal Polysaccharide-23 01/20/2016   Td 06/05/2011   Tdap 06/05/2011, 06/05/2011   Zoster Recombinat (Shingrix) 07/10/2019, 03/09/2020   Zoster, Live 11/29/2015    Health Maintenance  Topic Date Due   Medicare Annual Wellness (AWV)  12/28/2023   DTaP/Tdap/Td (5 - Td or Tdap) 04/24/2026   Pneumonia Vaccine 48+ Years old  Completed   INFLUENZA VACCINE  Completed   DEXA SCAN  Completed   COVID-19 Vaccine  Completed   Hepatitis C Screening  Completed   Zoster Vaccines- Shingrix  Completed   HPV VACCINES  Aged Out   COLONOSCOPY (Pts 45-20yrs Insurance coverage will need to be confirmed)  Discontinued       Assessment  This is a routine wellness examination for [employer].  Health Maintenance: Due or Overdue There are no preventive care reminders to display for this patient.   Heidi Barber does not need a referral for Community Assistance: Care Management:   no Social Work:    no Prescription Assistance:  no Nutrition/Diabetes Education:  no   Plan:  Personalized Goals  Goals Addressed               This Visit's Progress     Patient Stated (pt-stated)        Patient would like to continue to maintain her lifestyle.       Personalized Health Maintenance & Screening Recommendations  There are no preventive care reminders to display for this patient.  Lung Cancer Screening Recommended: no (Low Dose CT Chest recommended if Age 40-80 years, 30 pack-year currently smoking OR have quit w/in past 15 years) Hepatitis C Screening recommended: no HIV Screening recommended: no  Advanced Directives: Written information was not prepared per patient's request.  Referrals & Orders No orders of the defined types were placed in this encounter.   Follow-up Plan Follow-up with Samuel Bouche, NP as planned Medicare wellness visit in one year. Patient  will access AVS on my chart.   I have personally reviewed and noted the following in the patient's chart:   Medical and social history Use of alcohol, tobacco or illicit drugs  Current medications and supplements Functional ability and status Nutritional status Physical activity Advanced directives List of other physicians Hospitalizations, surgeries, and ER visits in previous 12 months Vitals Screenings to include cognitive, depression, and falls Referrals and appointments  In addition, I have reviewed and discussed with Heidi Barber certain preventive protocols, quality metrics, and best practice recommendations. A written personalized care plan for preventive services as well as general preventive health recommendations is available and can be mailed to the patient at her request.      Tinnie Gens, RN BSN  12/28/2022

## 2023-02-09 ENCOUNTER — Ambulatory Visit (INDEPENDENT_AMBULATORY_CARE_PROVIDER_SITE_OTHER): Payer: Medicare PPO | Admitting: Medical-Surgical

## 2023-02-09 ENCOUNTER — Encounter: Payer: Self-pay | Admitting: Medical-Surgical

## 2023-02-09 VITALS — BP 137/75 | HR 61 | Resp 20 | Ht 63.5 in | Wt 137.0 lb

## 2023-02-09 DIAGNOSIS — E782 Mixed hyperlipidemia: Secondary | ICD-10-CM | POA: Diagnosis not present

## 2023-02-09 DIAGNOSIS — E559 Vitamin D deficiency, unspecified: Secondary | ICD-10-CM

## 2023-02-09 DIAGNOSIS — Z1231 Encounter for screening mammogram for malignant neoplasm of breast: Secondary | ICD-10-CM | POA: Diagnosis not present

## 2023-02-09 DIAGNOSIS — D649 Anemia, unspecified: Secondary | ICD-10-CM

## 2023-02-09 DIAGNOSIS — Z0000 Encounter for general adult medical examination without abnormal findings: Secondary | ICD-10-CM

## 2023-02-09 MED ORDER — SIMVASTATIN 40 MG PO TABS: 40.0000 mg | ORAL_TABLET | Freq: Every day | ORAL | 3 refills | Status: DC

## 2023-02-09 MED ORDER — HYDROCHLOROTHIAZIDE 12.5 MG PO TABS: 12.5000 mg | ORAL_TABLET | Freq: Every day | ORAL | 3 refills | Status: DC

## 2023-02-09 NOTE — Progress Notes (Signed)
Complete physical exam  Patient: Heidi Barber   DOB: 1945/11/10   77 y.o. Female  MRN: [id number]  Subjective:    Chief Complaint  Patient presents with   Annual Exam    Heidi Barber is a 77 y.o. female who presents today for a complete physical exam. She reports consuming a general diet.  Staying active and doing yardwork.  She generally feels well. She reports sleeping well. She does not have additional problems to discuss today.    Most recent fall risk assessment:    02/09/2023    9:27 AM  Fall Risk   Falls in the past year? 0  Number falls in past yr: 0  Injury with Fall? 0  Risk for fall due to : No Fall Risks  Follow up Falls evaluation completed     Most recent depression screenings:    12/28/2022   10:55 AM 02/07/2022    2:44 PM  PHQ 2/9 Scores  PHQ - 2 Score 0 0    Vision:Within last year, Dental: No current dental problems and Receives regular dental care, and STD: The patient denies history of sexually transmitted disease.    Patient Care Team: Christen Butter, NP as PCP - General (Nurse Practitioner)   Outpatient Medications Prior to Visit  Medication Sig   Calcium Carbonate-Vitamin D (CALCIUM 600+D) 600-200 MG-UNIT TABS Take by mouth.   Cholecalciferol (VITAMIN D3) 50 MCG (2000 UT) TABS Take by mouth.   Coenzyme Q10 (COQ10) 100 MG CAPS Take by mouth.   multivitamin-lutein (OCUVITE-LUTEIN) CAPS capsule Take 1 capsule by mouth daily. Once a day   Vitamin E 180 MG CAPS Take by mouth.   [DISCONTINUED] hydrochlorothiazide (HYDRODIURIL) 12.5 MG tablet Take 1 tablet (12.5 mg total) by mouth daily.   [DISCONTINUED] simvastatin (ZOCOR) 40 MG tablet Take 1 tablet (40 mg total) by mouth daily.   [DISCONTINUED] aspirin 81 MG EC tablet Take 81 mg by mouth daily. Swallow whole.   No facility-administered medications prior to visit.   Review of Systems  Constitutional:  Negative for chills, fever, malaise/fatigue and weight loss.  HENT:  Negative for  congestion, ear pain, hearing loss, sinus pain and sore throat.   Eyes:  Negative for blurred vision, photophobia and pain.  Respiratory:  Negative for cough, shortness of breath and wheezing.   Cardiovascular:  Negative for chest pain, palpitations and leg swelling.  Gastrointestinal:  Negative for abdominal pain, constipation, diarrhea, heartburn, nausea and vomiting.  Genitourinary:  Negative for dysuria, frequency and urgency.  Musculoskeletal:  Negative for falls and neck pain.  Skin:  Negative for itching and rash.  Neurological:  Negative for dizziness, weakness and headaches.  Endo/Heme/Allergies:  Negative for polydipsia. Does not bruise/bleed easily.  Psychiatric/Behavioral:  Negative for depression, substance abuse and suicidal ideas. The patient is not nervous/anxious.      Objective:    BP 137/75 (BP Location: Right Arm, Cuff Size: Normal)   Pulse 61   Resp 20   Ht 5' 3.5" (1.613 m)   Wt 137 lb (62.1 kg)   SpO2 99%   BMI 23.89 kg/m    Physical Exam Constitutional:      General: She is not in acute distress.    Appearance: Normal appearance. She is not ill-appearing.  HENT:     Head: Normocephalic and atraumatic.     Right Ear: Tympanic membrane, ear canal and external ear normal. There is no impacted cerumen.     Left Ear: Tympanic membrane, ear  canal and external ear normal. There is no impacted cerumen.     Nose: Nose normal. No congestion or rhinorrhea.     Mouth/Throat:     Mouth: Mucous membranes are moist.     Pharynx: No oropharyngeal exudate or posterior oropharyngeal erythema.  Eyes:     General: No scleral icterus.       Right eye: No discharge.        Left eye: No discharge.     Extraocular Movements: Extraocular movements intact.     Conjunctiva/sclera: Conjunctivae normal.     Pupils: Pupils are equal, round, and reactive to light.  Neck:     Thyroid: No thyromegaly.     Vascular: No carotid bruit or JVD.     Trachea: Trachea normal.   Cardiovascular:     Rate and Rhythm: Normal rate and regular rhythm.     Pulses: Normal pulses.     Heart sounds: Normal heart sounds. No murmur heard.    No friction rub. No gallop.  Pulmonary:     Effort: Pulmonary effort is normal. No respiratory distress.     Breath sounds: Normal breath sounds. No wheezing.  Abdominal:     General: Bowel sounds are normal. There is no distension.     Palpations: Abdomen is soft.     Tenderness: There is no abdominal tenderness. There is no guarding.  Musculoskeletal:        General: Normal range of motion.     Cervical back: Normal range of motion and neck supple.  Lymphadenopathy:     Cervical: No cervical adenopathy.  Skin:    General: Skin is warm and dry.  Neurological:     Mental Status: She is alert and oriented to person, place, and time.     Cranial Nerves: No cranial nerve deficit.  Psychiatric:        Mood and Affect: Mood normal.        Behavior: Behavior normal.        Thought Content: Thought content normal.        Judgment: Judgment normal.      No results found for any visits on 02/09/23.     Assessment & Plan:    Routine Health Maintenance and Physical Exam  Immunization History  Administered Date(s) Administered   COVID-19, mRNA, vaccine(Comirnaty)12 years and older 07/26/2022   DTaP 04/24/2016   Fluad Quad(high Dose 65+) 06/10/2019   Influenza-Unspecified 07/08/2014, 07/29/2015, 06/10/2019, 07/08/2020, 06/21/2021, 06/09/2022   MODERNA COVID-19 SARS-COV-2 PEDS BIVALENT BOOSTER 6Y-11Y 07/14/2021   Moderna Covid-19 Vaccine Bivalent Booster 70yrs & up 07/14/2021   Moderna Sars-Covid-2 Vaccination 11/04/2019, 12/02/2019, 08/09/2020, 01/06/2021, 02/08/2022   PFIZER(Purple Top)SARS-COV-2 Vaccination 11/04/2019, 12/02/2019   Pneumococcal Conjugate-13 11/10/2014, 12/07/2014   Pneumococcal Polysaccharide-23 01/20/2016   Td 06/05/2011   Tdap 06/05/2011, 06/05/2011   Zoster Recombinat (Shingrix) 07/10/2019, 03/09/2020    Zoster, Live 11/29/2015    Health Maintenance  Topic Date Due   INFLUENZA VACCINE  05/10/2023   Medicare Annual Wellness (AWV)  12/28/2023   DTaP/Tdap/Td (5 - Td or Tdap) 04/24/2026   Pneumonia Vaccine 38+ Years old  Completed   DEXA SCAN  Completed   COVID-19 Vaccine  Completed   Hepatitis C Screening  Completed   Zoster Vaccines- Shingrix  Completed   HPV VACCINES  Aged Out   COLONOSCOPY (Pts 45-61yrs Insurance coverage will need to be confirmed)  Discontinued    Discussed health benefits of physical activity, and encouraged her to engage in regular exercise  appropriate for her age and condition.  1. Annual physical exam Checking labs as below. UTD on preventative care. Wellness information provided with AVS. - CBC with Differential/Platelet - COMPLETE METABOLIC PANEL WITH GFR - Lipid panel  2. Encounter for screening mammogram for malignant neoplasm of breast Mammogram ordered.  - MM DIGITAL SCREENING BILATERAL; Future  3. Vitamin D deficiency Checking Vitamin D.  - VITAMIN D 25 Hydroxy (Vit-D Deficiency, Fractures)  4. Mixed hyperlipidemia Checking labs. Continue Zocor 40mg  daily.  - COMPLETE METABOLIC PANEL WITH GFR - Lipid panel  Return in about 6 months (around 08/12/2023) for chronic disease follow up.   Christen Butter, NP

## 2023-02-14 ENCOUNTER — Other Ambulatory Visit: Payer: Self-pay | Admitting: Medical-Surgical

## 2023-02-14 DIAGNOSIS — E559 Vitamin D deficiency, unspecified: Secondary | ICD-10-CM | POA: Diagnosis not present

## 2023-02-14 DIAGNOSIS — Z0000 Encounter for general adult medical examination without abnormal findings: Secondary | ICD-10-CM | POA: Diagnosis not present

## 2023-02-14 DIAGNOSIS — D649 Anemia, unspecified: Secondary | ICD-10-CM

## 2023-02-14 DIAGNOSIS — E782 Mixed hyperlipidemia: Secondary | ICD-10-CM | POA: Diagnosis not present

## 2023-02-14 LAB — CBC WITH DIFFERENTIAL/PLATELET
Basophils Absolute: 59 cells/uL (ref 0–200)
Lymphs Abs: 2100 cells/uL (ref 850–3900)
MCH: 28.4 pg (ref 27.0–33.0)
Neutrophils Relative %: 53.5 %
RBC: 4.08 10*6/uL (ref 3.80–5.10)
RDW: 12.9 % (ref 11.0–15.0)
WBC: 6.5 10*3/uL (ref 3.8–10.8)

## 2023-02-15 ENCOUNTER — Other Ambulatory Visit: Payer: Self-pay

## 2023-02-15 DIAGNOSIS — D649 Anemia, unspecified: Secondary | ICD-10-CM

## 2023-02-15 LAB — CBC WITH DIFFERENTIAL/PLATELET
Absolute Monocytes: 696 cells/uL (ref 200–950)
Basophils Relative: 0.9 %
Eosinophils Absolute: 169 cells/uL (ref 15–500)
Eosinophils Relative: 2.6 %
HCT: 36.8 % (ref 35.0–45.0)
Hemoglobin: 11.6 g/dL — ABNORMAL LOW (ref 11.7–15.5)
MCHC: 31.5 g/dL — ABNORMAL LOW (ref 32.0–36.0)
MCV: 90.2 fL (ref 80.0–100.0)
MPV: 10.2 fL (ref 7.5–12.5)
Monocytes Relative: 10.7 %
Neutro Abs: 3478 cells/uL (ref 1500–7800)
Platelets: 323 10*3/uL (ref 140–400)
Total Lymphocyte: 32.3 %

## 2023-02-15 LAB — LIPID PANEL
Cholesterol: 163 mg/dL (ref <200)
HDL: 67 mg/dL (ref >=50)
LDL Cholesterol (Calc): 82 mg/dL (calc)
Non-HDL Cholesterol (Calc): 96 mg/dL (calc) (ref <130)
Total CHOL/HDL Ratio: 2.4 (calc) (ref <5.0)
Triglycerides: 63 mg/dL (ref <150)

## 2023-02-15 LAB — COMPLETE METABOLIC PANEL WITH GFR
AG Ratio: 1.4 (calc) (ref 1.0–2.5)
ALT: 18 U/L (ref 6–29)
AST: 22 U/L (ref 10–35)
Albumin: 4 g/dL (ref 3.6–5.1)
Alkaline phosphatase (APISO): 60 U/L (ref 37–153)
BUN: 21 mg/dL (ref 7–25)
CO2: 25 mmol/L (ref 20–32)
Calcium: 9.5 mg/dL (ref 8.6–10.4)
Chloride: 104 mmol/L (ref 98–110)
Creat: 0.78 mg/dL (ref 0.60–1.00)
Globulin: 2.8 g/dL (calc) (ref 1.9–3.7)
Glucose, Bld: 118 mg/dL — ABNORMAL HIGH (ref 65–99)
Potassium: 4.5 mmol/L (ref 3.5–5.3)
Sodium: 140 mmol/L (ref 135–146)
Total Bilirubin: 0.3 mg/dL (ref 0.2–1.2)
Total Protein: 6.8 g/dL (ref 6.1–8.1)
eGFR: 79 mL/min/{1.73_m2} (ref >=60)

## 2023-02-15 LAB — VITAMIN D 25 HYDROXY (VIT D DEFICIENCY, FRACTURES): Vit D, 25-Hydroxy: 41 ng/mL (ref 30–100)

## 2023-02-15 NOTE — Addendum Note (Signed)
Addended byChristen Butter on: 02/15/2023 06:59 AM   Modules accepted: Orders

## 2023-03-02 DIAGNOSIS — D649 Anemia, unspecified: Secondary | ICD-10-CM | POA: Diagnosis not present

## 2023-03-02 LAB — CBC
HCT: 36.4 % (ref 35.0–45.0)
Hemoglobin: 11.3 g/dL — ABNORMAL LOW (ref 11.7–15.5)
MCH: 27.9 pg (ref 27.0–33.0)
MCHC: 31 g/dL — ABNORMAL LOW (ref 32.0–36.0)
MCV: 89.9 fL (ref 80.0–100.0)
MPV: 10.2 fL (ref 7.5–12.5)
Platelets: 304 10*3/uL (ref 140–400)
RBC: 4.05 Million/uL (ref 3.80–5.10)
RDW: 13.3 % (ref 11.0–15.0)
WBC: 6.6 10*3/uL (ref 3.8–10.8)

## 2023-03-06 ENCOUNTER — Telehealth: Payer: Self-pay

## 2023-03-06 ENCOUNTER — Ambulatory Visit (INDEPENDENT_AMBULATORY_CARE_PROVIDER_SITE_OTHER): Payer: Medicare PPO | Admitting: Medical-Surgical

## 2023-03-06 VITALS — BP 129/61 | HR 76 | Temp 97.6°F | Ht 63.5 in

## 2023-03-06 DIAGNOSIS — M810 Age-related osteoporosis without current pathological fracture: Secondary | ICD-10-CM | POA: Diagnosis not present

## 2023-03-06 MED ORDER — DENOSUMAB 60 MG/ML ~~LOC~~ SOSY
60.0000 mg | PREFILLED_SYRINGE | Freq: Once | SUBCUTANEOUS | Status: AC
Start: 2023-03-06 — End: 2023-03-06
  Administered 2023-03-06: 60 mg via SUBCUTANEOUS

## 2023-03-06 NOTE — Progress Notes (Signed)
   Established Patient Office Visit  Subjective   Patient ID: Heidi Barber, female    DOB: Dec 16, 1945  Age: 77 y.o. MRN: [id number]  Chief Complaint  Patient presents with   Osteoporosis    Nurse visit -Prolia injection    HPI  Prolia injection- nurse visit. Patient denies problems or side effects with previous Prolia injection given on 08/29/2022. Patient verified that she is taking calcium and vit D daily.  Patient calcium and kidney function within normal limits obtained on 05/082024.  ROS    Objective:     BP 129/61   Pulse 76   Temp 97.6 F (36.4 C)   Ht 5' 3.5" (1.613 m)   SpO2 100%   BMI 23.89 kg/m    Physical Exam   No results found for any visits on 03/06/23.    The 10-year ASCVD risk score (Arnett DK, et al., 2019) is: 23.6%    Assessment & Plan:  Admin Prolia injection  SQ Right arm. Patient tolerated injection well without complications. Patient will return in 6 months and one day for next Prolia injection. ( Patient informed to have lab work at least one week before injection)  Problem List Items Addressed This Visit       Musculoskeletal and Integument   Age-related osteoporosis without current pathological fracture - Primary    Return for return in 6 months and one day for next Prolia injection. Elizabeth Palau, LPN

## 2023-03-06 NOTE — Progress Notes (Signed)
Agree with documentation as below.  ___________________________________________ Colton Tassin L. Montie Gelardi, DNP, APRN, FNP-BC Primary Care and Sports Medicine [facility]   

## 2023-03-06 NOTE — Telephone Encounter (Signed)
Called for [place] - [phone] Trula Ore representative states PA already in place  Approval # [id number] Good from 09/04/2022 - 12/331/2024.

## 2023-03-06 NOTE — Patient Instructions (Addendum)
return in 6 months and one day for next Prolia injection. ( Patient informed to have lab work at least one week before injection)

## 2023-03-08 ENCOUNTER — Other Ambulatory Visit: Payer: Self-pay

## 2023-03-08 DIAGNOSIS — R8290 Unspecified abnormal findings in urine: Secondary | ICD-10-CM

## 2023-03-09 DIAGNOSIS — R8290 Unspecified abnormal findings in urine: Secondary | ICD-10-CM | POA: Diagnosis not present

## 2023-03-10 LAB — URINALYSIS W MICROSCOPIC + REFLEX CULTURE
Bacteria, UA: NONE SEEN /HPF
Bilirubin Urine: NEGATIVE
Glucose, UA: NEGATIVE
Hgb urine dipstick: NEGATIVE
Hyaline Cast: NONE SEEN /LPF
Ketones, ur: NEGATIVE
Leukocyte Esterase: NEGATIVE
Nitrites, Initial: NEGATIVE
Protein, ur: NEGATIVE
RBC / HPF: NONE SEEN /HPF (ref 0–2)
Specific Gravity, Urine: 1.018 (ref 1.001–1.035)
Squamous Epithelial / HPF: NONE SEEN /HPF (ref <=5)
WBC, UA: NONE SEEN /HPF (ref 0–5)
pH: 5.5 (ref 5.0–8.0)

## 2023-03-10 LAB — NO CULTURE INDICATED

## 2023-03-15 ENCOUNTER — Ambulatory Visit (INDEPENDENT_AMBULATORY_CARE_PROVIDER_SITE_OTHER): Payer: Medicare PPO

## 2023-03-15 DIAGNOSIS — Z1231 Encounter for screening mammogram for malignant neoplasm of breast: Secondary | ICD-10-CM

## 2023-03-16 ENCOUNTER — Telehealth: Payer: Self-pay

## 2023-03-16 NOTE — Telephone Encounter (Signed)
Patient returned  Hemoccult stool cards.  Documented as below and ran by Jola Babinski and myself.  June 1 - am sample = negative June 3 - am sample= negative June 3 -pm sample = negative

## 2023-03-21 LAB — HEMOCCULT GUIAC POC 1CARD (OFFICE)
Card #2 Fecal Occult Blod, POC: NEGATIVE
Card #3 Fecal Occult Blood, POC: NEGATIVE
Fecal Occult Blood, POC: NEGATIVE

## 2023-03-21 NOTE — Addendum Note (Signed)
Addended by: Elizabeth Palau on: 03/21/2023 12:28 PM   Modules accepted: Orders

## 2023-06-12 IMAGING — MG MM DIGITAL SCREENING BILAT W/ TOMO AND CAD
6 of 10 series · 6 of 30 positions shown · non-contrast
Comparison: Previous exam(s).

CLINICAL DATA: Screening.

EXAM:
DIGITAL SCREENING BILATERAL MAMMOGRAM WITH TOMOSYNTHESIS AND CAD
TECHNIQUE: Bilateral screening digital craniocaudal and mediolateral oblique
mammograms were obtained. Bilateral screening digital breast
tomosynthesis was performed. The images were evaluated with
computer-aided detection.

[L CC synth-2D]
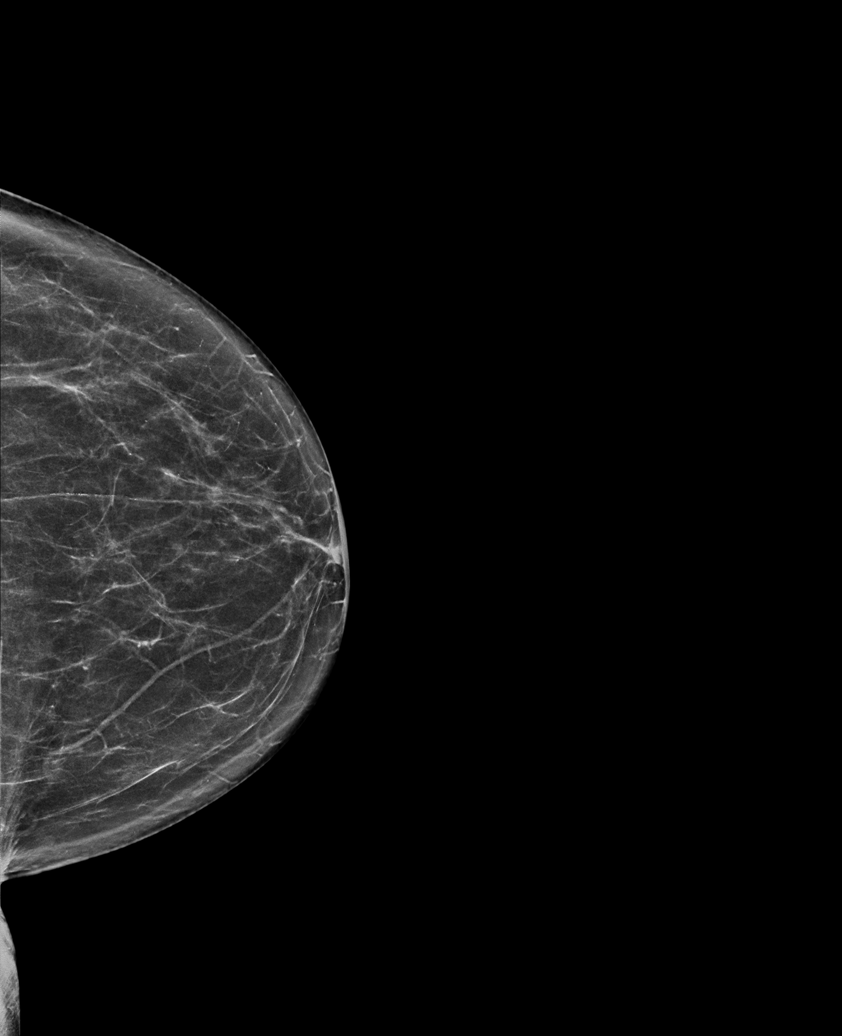

[R MLO synth-2D (1 of 2)]
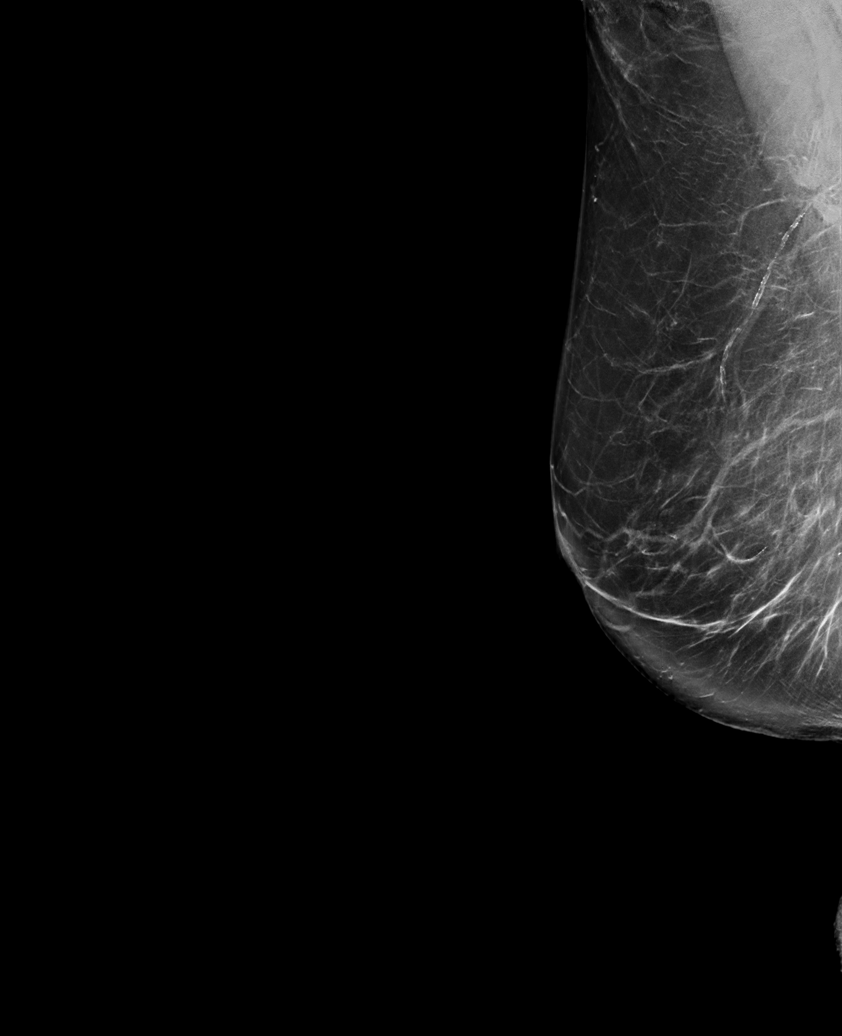

[L MLO synth-2D]
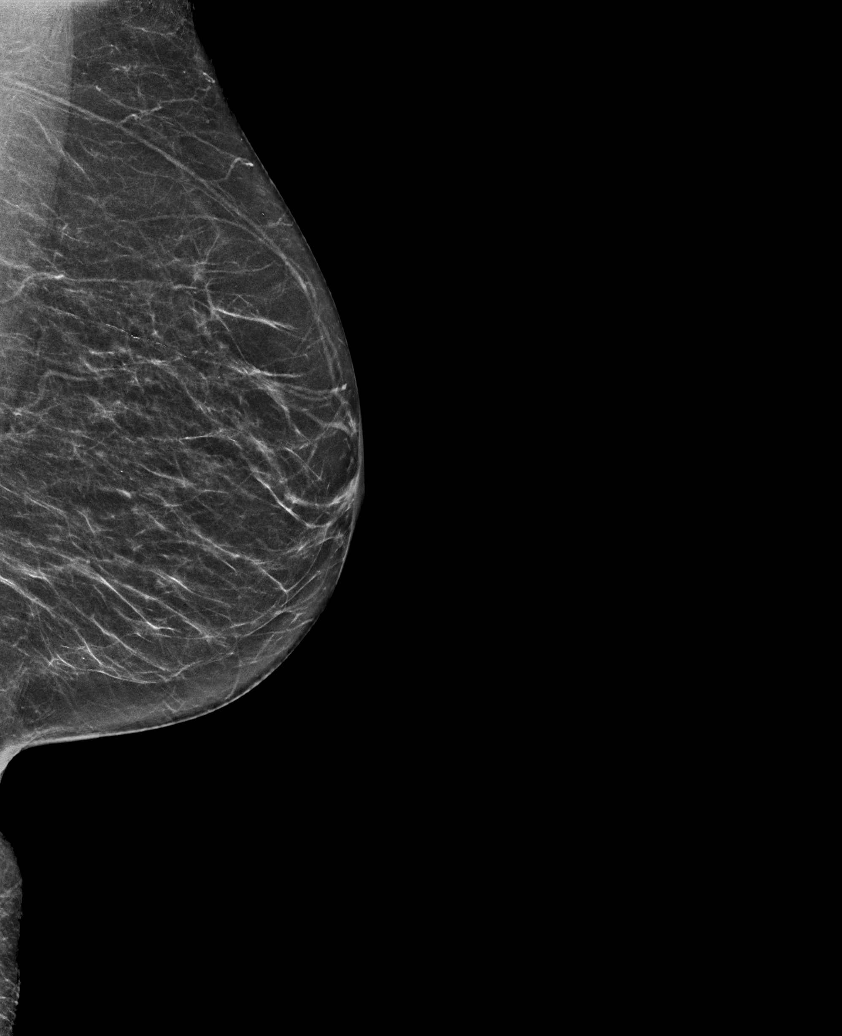

[R MLO synth-2D (2 of 2)]
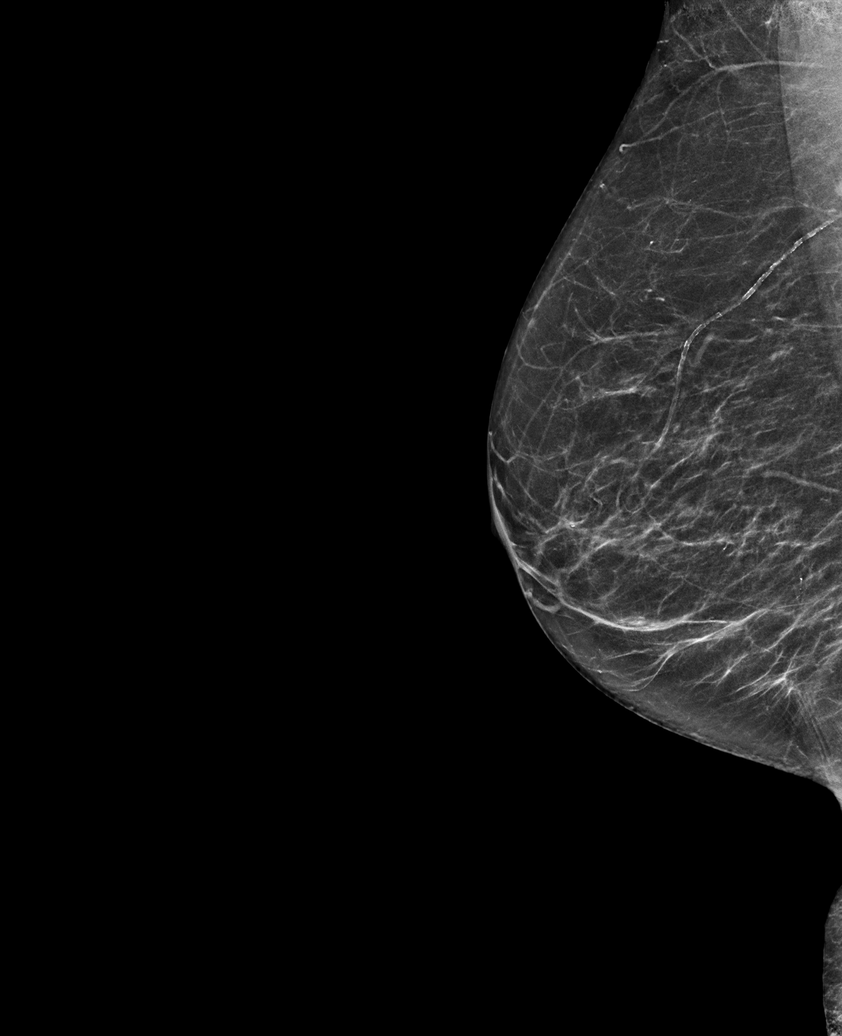

[R CC synth-2D]
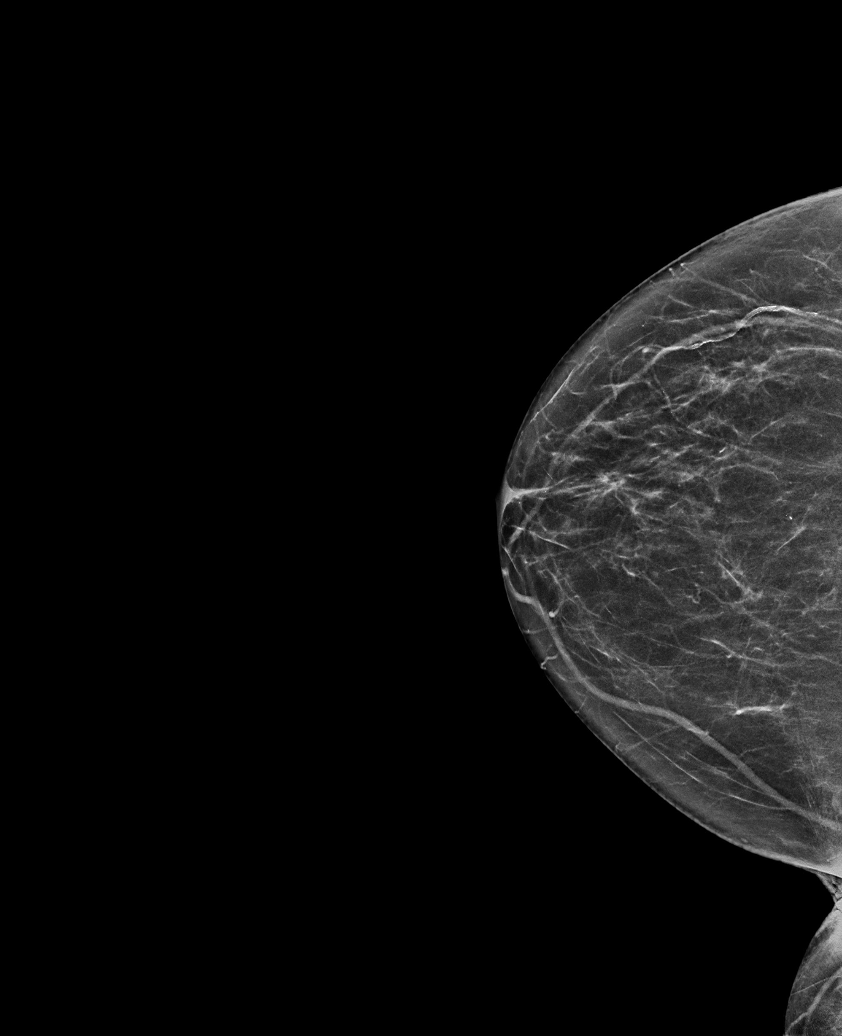

[R MLO tomo · tomo slice 31/61.0]
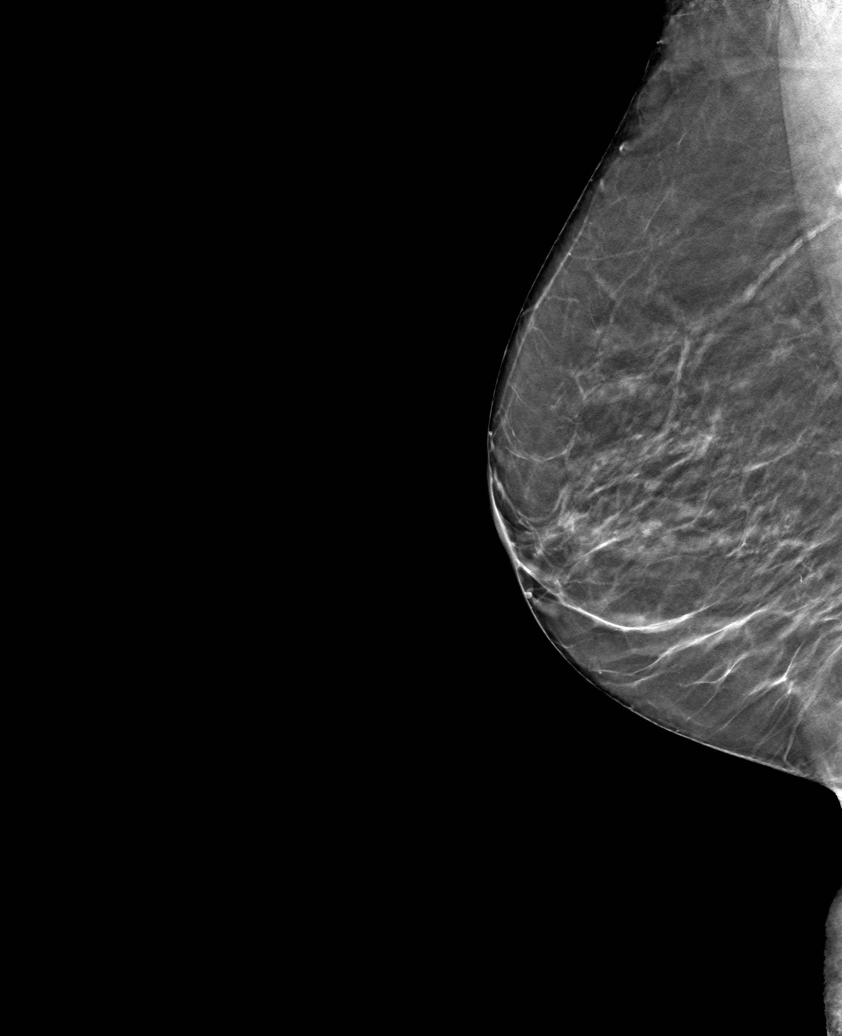

[6 of 30 positions shown; findings below may reference images not displayed]

ACR Breast Density Category b: There are scattered areas of
fibroglandular density.
FINDINGS: There are no findings suspicious for malignancy.
IMPRESSION: No mammographic evidence of malignancy. A result letter of this
screening mammogram will be mailed directly to the patient.

RECOMMENDATION:
Screening mammogram in one year. (Code:51-O-LD2)

BI-RADS CATEGORY  1: Negative.

## 2023-08-13 ENCOUNTER — Encounter: Payer: Self-pay | Admitting: Medical-Surgical

## 2023-08-13 ENCOUNTER — Ambulatory Visit: Payer: Medicare PPO | Admitting: Medical-Surgical

## 2023-08-13 VITALS — BP 133/74 | HR 61 | Resp 20 | Ht 63.5 in | Wt 136.9 lb

## 2023-08-13 DIAGNOSIS — E782 Mixed hyperlipidemia: Secondary | ICD-10-CM

## 2023-08-13 DIAGNOSIS — F4321 Adjustment disorder with depressed mood: Secondary | ICD-10-CM | POA: Diagnosis not present

## 2023-08-13 DIAGNOSIS — E559 Vitamin D deficiency, unspecified: Secondary | ICD-10-CM | POA: Diagnosis not present

## 2023-08-13 DIAGNOSIS — I499 Cardiac arrhythmia, unspecified: Secondary | ICD-10-CM | POA: Diagnosis not present

## 2023-08-13 DIAGNOSIS — M810 Age-related osteoporosis without current pathological fracture: Secondary | ICD-10-CM | POA: Diagnosis not present

## 2023-08-13 DIAGNOSIS — I10 Essential (primary) hypertension: Secondary | ICD-10-CM | POA: Insufficient documentation

## 2023-08-13 DIAGNOSIS — Z634 Disappearance and death of family member: Secondary | ICD-10-CM
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
## 2023-08-14 LAB — CBC
Hematocrit: 46.6 % (ref 34.0–46.6)
Hemoglobin: 14.9 g/dL (ref 11.1–15.9)
MCH: 31 pg (ref 26.6–33.0)
MCHC: 32 g/dL (ref 31.5–35.7)
MCV: 97 fL (ref 79–97)
Platelets: 256 10*3/uL (ref 150–450)
RBC: 4.81 x10E6/uL (ref 3.77–5.28)
RDW: 12.9 % (ref 11.7–15.4)
WBC: 6.7 10*3/uL (ref 3.4–10.8)

## 2023-08-14 LAB — CMP14+EGFR
ALT: 21 [IU]/L (ref 0–32)
AST: 22 [IU]/L (ref 0–40)
Albumin: 4.4 g/dL (ref 3.8–4.8)
Alkaline Phosphatase: 76 [IU]/L (ref 44–121)
BUN/Creatinine Ratio: 21 (ref 12–28)
BUN: 15 mg/dL (ref 8–27)
Bilirubin Total: 0.4 mg/dL (ref 0.0–1.2)
CO2: 24 mmol/L (ref 20–29)
Calcium: 9.7 mg/dL (ref 8.7–10.3)
Chloride: 99 mmol/L (ref 96–106)
Creatinine, Ser: 0.7 mg/dL (ref 0.57–1.00)
Globulin, Total: 2.7 g/dL (ref 1.5–4.5)
Glucose: 98 mg/dL (ref 70–99)
Potassium: 4.4 mmol/L (ref 3.5–5.2)
Sodium: 140 mmol/L (ref 134–144)
Total Protein: 7.1 g/dL (ref 6.0–8.5)
eGFR: 89 mL/min/{1.73_m2} (ref >59)

## 2023-09-10 ENCOUNTER — Ambulatory Visit: Payer: Medicare PPO

## 2023-09-10 DIAGNOSIS — M810 Age-related osteoporosis without current pathological fracture: Secondary | ICD-10-CM

## 2023-09-10 MED ORDER — DENOSUMAB 60 MG/ML ~~LOC~~ SOSY
60.0000 mg | PREFILLED_SYRINGE | Freq: Once | SUBCUTANEOUS | Status: AC
Start: 2023-09-10 — End: 2023-09-10
  Administered 2023-09-10: 60 mg via SUBCUTANEOUS

## 2023-09-10 MED ORDER — DENOSUMAB 60 MG/ML ~~LOC~~ SOSY
60.0000 mg | PREFILLED_SYRINGE | Freq: Once | SUBCUTANEOUS | Status: AC
Start: 2024-03-11 — End: 2024-03-13
  Administered 2024-03-13: 60 mg via SUBCUTANEOUS

## 2023-09-10 NOTE — Progress Notes (Signed)
   Established Patient Office Visit  Subjective   Patient ID: Heidi Barber, female    DOB: 04-20-1946  Age: 77 y.o. MRN: [id number]  Chief Complaint  Patient presents with   Osteoporosis    HPI  Heidi Barber is here for Prolia injection.   ROS    Objective:     There were no vitals taken for this visit.   Physical Exam   No results found for any visits on 09/10/23.    The 10-year ASCVD risk score (Arnett DK, et al., 2019) is: 27.8%    Assessment & Plan:  Prolia injection - Patient tolerated injection well without complications. Patient advised to schedule next injection 6 months and 1 day from today.   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 Earna Coder, Janalyn Harder, CMA

## 2023-12-12 DIAGNOSIS — H2513 Age-related nuclear cataract, bilateral: Secondary | ICD-10-CM | POA: Diagnosis not present

## 2023-12-12 DIAGNOSIS — H524 Presbyopia: Secondary | ICD-10-CM | POA: Diagnosis not present

## 2023-12-31 ENCOUNTER — Ambulatory Visit (INDEPENDENT_AMBULATORY_CARE_PROVIDER_SITE_OTHER): Payer: Medicare PPO

## 2023-12-31 VITALS — Ht 63.5 in | Wt 138.0 lb

## 2023-12-31 DIAGNOSIS — Z0000 Encounter for general adult medical examination without abnormal findings: Secondary | ICD-10-CM

## 2023-12-31 NOTE — Patient Instructions (Signed)
  Heidi Barber , Thank you for taking time to come for your Medicare Wellness Visit. I appreciate your ongoing commitment to your health goals. Please review the following plan we discussed and let me know if I can assist you in the future.   These are the goals we discussed:  Goals       Patient Stated (pt-stated)      11/26/2020 AWV Goal: Improved Nutrition/Diet  Patient will verbalize understanding that diet plays an important role in overall health and that a poor diet is a risk factor for many chronic medical conditions.  Over the next year, patient will improve self management of their diet by incorporating more water. Patient will utilize available community resources to help with food acquisition if needed (ex: food pantries, [address], etc) Patient will work with nutrition specialist if a referral was made       Patient Stated (pt-stated)      Loose 5 lbs.      Patient Stated (pt-stated)      Patient would like to continue to maintain her lifestyle.      Patient Stated      She would like to continue with a healthy life style.       Weight (lb) < 200 lb (90.7 kg)      Patient stated would like to loose 3-5 lbs        This is a list of the screening recommended for you and due dates:  Health Maintenance  Topic Date Due   COVID-19 Vaccine (12 - 2024-25 season) 12/11/2023   Mammogram  03/14/2024   Medicare Annual Wellness Visit  12/30/2024   DTaP/Tdap/Td vaccine (5 - Td or Tdap) 04/24/2026   Pneumonia Vaccine  Completed   Flu Shot  Completed   DEXA scan (bone density measurement)  Completed   Hepatitis C Screening  Completed   Zoster (Shingles) Vaccine  Completed   HPV Vaccine  Aged Out   Colon Cancer Screening  Discontinued

## 2023-12-31 NOTE — Progress Notes (Signed)
 Subjective:   Heidi Barber is a 78 y.o. female who presents for Medicare Annual (Subsequent) preventive examination.  Visit Complete: Virtual I connected with  Heidi Barber on 12/31/23 by a audio enabled telemedicine application and verified that I am speaking with the correct person using two identifiers.  Patient Location: Home  Provider Location: Office/Clinic  I discussed the limitations of evaluation and management by telemedicine. The patient expressed understanding and agreed to proceed.  Vital Signs: Because this visit was a virtual/telehealth visit, some criteria may be missing or patient reported. Any vitals not documented were not able to be obtained and vitals that have been documented are patient reported.  Patient Medicare AWV questionnaire was completed by the patient on 12/29/2023; I have confirmed that all information answered by patient is correct and no changes since this date.  Cardiac Risk Factors include: advanced age (>38men, >77 women);dyslipidemia;hypertension;family history of premature cardiovascular disease     Objective:    Today's Vitals   12/31/23 1055  Weight: 138 lb (62.6 kg)  Height: 5' 3.5" (1.613 m)   Body mass index is 24.06 kg/m.     12/31/2023   11:01 AM 12/28/2022   10:53 AM 12/21/2021   10:11 AM 11/26/2020    9:09 AM 11/17/2019    8:55 AM  Advanced Directives  Does Patient Have a Medical Advance Directive? Yes Yes Yes Yes Yes  Type of Estate agent of [place];Living will Living will Living will Healthcare Power of [place];Living will Healthcare Power of [place];Living will  Does patient want to make changes to medical advance directive? No - Patient declined No - Patient declined No - Patient declined No - Patient declined No - Patient declined  Copy of Healthcare Power of Attorney in Chart? Yes - validated most recent copy scanned in chart (See row information)   No - copy requested No - copy requested     Current Medications (verified) Outpatient Encounter Medications as of 12/31/2023  Medication Sig   Calcium Carbonate-Vitamin D (CALCIUM 600+D) 600-200 MG-UNIT TABS Take by mouth.   Cholecalciferol (VITAMIN D3) 50 MCG (2000 UT) TABS Take by mouth.   Coenzyme Q10 (COQ10) 100 MG CAPS Take by mouth.   hydrochlorothiazide (HYDRODIURIL) 12.5 MG tablet Take 1 tablet (12.5 mg total) by mouth daily.   multivitamin-lutein (OCUVITE-LUTEIN) CAPS capsule Take 1 capsule by mouth daily. Once a day   simvastatin (ZOCOR) 40 MG tablet Take 1 tablet (40 mg total) by mouth daily.   Vitamin E 180 MG CAPS Take by mouth.   Facility-Administered Encounter Medications as of 12/31/2023  Medication   [START ON 03/11/2024] denosumab (PROLIA) injection 60 mg    Allergies (verified) Codeine, Other, and Sulfa antibiotics   History: Past Medical History:  Diagnosis Date   Allergy    hayfever   Cataract    High cholesterol    Hypertension    Maybe?   Osteoporosis    Past Surgical History:  Procedure Laterality Date   COSMETIC SURGERY     corrected a deviated septum   HERNIA REPAIR  abt 2012   LAPAROSCOPIC INTERNAL HERNIA REPAIR  2015   NASAL SEPTUM SURGERY  1987   VAGINAL HYSTERECTOMY     Family History  Problem Relation Age of Onset   Stroke Mother    High blood pressure Father    Skin cancer Father    Hypertension Father    Stroke Maternal Grandmother    Social History   Socioeconomic History  Marital status: Married    Spouse name: Richard   Number of children: 2   Years of education: 18   Highest education level: Master's degree (e.g., MA, MS, MEng, MEd, MSW, MBA)  Occupational History   Occupation: Retired    Comment: Runner, broadcasting/film/video  Tobacco Use   Smoking status: Never   Smokeless tobacco: Never  Vaping Use   Vaping status: Never Used  Substance and Sexual Activity   Alcohol use: Yes    Alcohol/week: 1.0 standard drink of alcohol    Types: 1 Glasses of wine per week    Comment:  occasionally   Drug use: Never   Sexual activity: Not Currently    Partners: Male  Other Topics Concern   Not on file  Social History Narrative   Retired Runner, broadcasting/film/video. Likes to do writing and research on her computer.    Social Drivers of Corporate investment banker Strain: Low Risk  (12/31/2023)   Overall Financial Resource Strain (CARDIA)    Difficulty of Paying Living Expenses: Not hard at all  Food Insecurity: No Food Insecurity (12/31/2023)   Hunger Vital Sign    Worried About Running Out of Food in the Last Year: Never true    Ran Out of Food in the Last Year: Never true  Transportation Needs: No Transportation Needs (12/31/2023)   PRAPARE - Administrator, Civil Service (Medical): No    Lack of Transportation (Non-Medical): No  Physical Activity: Sufficiently Active (12/31/2023)   Exercise Vital Sign    Days of Exercise per Week: 7 days    Minutes of Exercise per Session: 40 min  Stress: No Stress Concern Present (12/31/2023)   [employer] of Occupational Health - Occupational Stress Questionnaire    Feeling of Stress : Not at all  Social Connections: Moderately Isolated (12/31/2023)   Social Connection and Isolation Panel [NHANES]    Frequency of Communication with Friends and Family: More than three times a week    Frequency of Social Gatherings with Friends and Family: More than three times a week    Attends Religious Services: Never    Database administrator or Organizations: No    Attends Engineer, structural: Never    Marital Status: Married    Tobacco Counseling Counseling given: Not Answered   Clinical Intake:  Pre-visit preparation completed: Yes  Pain : No/denies pain     BMI - recorded: 24.06 Nutritional Status: BMI of 19-24  Normal Nutritional Risks: None Diabetes: No  How often do you need to have someone help you when you read instructions, pamphlets, or other written materials from your doctor or pharmacy?: 1 - Never What  is the last grade level you completed in school?: 18  Interpreter Needed?: No      Activities of Daily Living    12/31/2023   10:56 AM 12/29/2023    9:49 AM  In your present state of health, do you have any difficulty performing the following activities:  Hearing? 0 0  Vision? 0 0  Difficulty concentrating or making decisions? 0 0  Walking or climbing stairs? 0 0  Dressing or bathing? 0 0  Doing errands, shopping? 0 0  Preparing Food and eating ? N N  Using the Toilet? N N  In the past six months, have you accidently leaked urine? N N  Do you have problems with loss of bowel control? N N  Managing your Medications? N N  Managing your Finances? N N  Housekeeping or managing your Housekeeping? N N    Patient Care Team: Christen Butter, NP as PCP - General (Nurse Practitioner)  Indicate any recent Medical Services you may have received from other than [facility] providers in the past year (date may be approximate).     Assessment:   This is a routine wellness examination for Tamella.  Hearing/Vision screen No results found.   Goals Addressed             This Visit's Progress    Patient Stated       She would like to continue with a healthy life style.       Depression Screen    12/31/2023   11:01 AM 12/28/2022   10:55 AM 02/07/2022    2:44 PM 12/21/2021   10:11 AM 12/07/2020    9:08 AM 11/26/2020    9:10 AM 12/08/2019    1:18 PM  PHQ 2/9 Scores  PHQ - 2 Score 0 0 0 0 0 0 0  PHQ- 9 Score      0 2    Fall Risk    12/31/2023   11:02 AM 12/29/2023    9:49 AM 02/09/2023    9:27 AM 12/28/2022   10:55 AM 12/27/2022   11:01 AM  Fall Risk   Falls in the past year? 0 0 0 0 0  Number falls in past yr: 0  0 0 0  Injury with Fall? 0 0 0 0 0  Risk for fall due to : No Fall Risks  No Fall Risks No Fall Risks   Follow up Falls evaluation completed  Falls evaluation completed Falls evaluation completed     MEDICARE RISK AT HOME: Medicare Risk at Home Any stairs in or around the  home?: No If so, are there any without handrails?: No Home free of loose throw rugs in walkways, pet beds, electrical cords, etc?: Yes Adequate lighting in your home to reduce risk of falls?: Yes Life alert?: No Use of a cane, walker or w/c?: No Grab bars in the bathroom?: No Shower chair or bench in shower?: Yes Elevated toilet seat or a handicapped toilet?: Yes  TIMED UP AND GO:  Was the test performed?  No    Cognitive Function:        12/31/2023   11:02 AM 12/28/2022   10:57 AM 12/21/2021   10:19 AM 11/26/2020    9:18 AM 11/17/2019    8:58 AM  6CIT Screen  What Year? 0 points 0 points 0 points 0 points 0 points  What month? 0 points 0 points 0 points 0 points 0 points  What time? 0 points 0 points 0 points 0 points 0 points  Count back from 20 0 points 0 points 0 points 0 points 0 points  Months in reverse 0 points 0 points 0 points 0 points 0 points  Repeat phrase 0 points 0 points 0 points 0 points 0 points  Total Score 0 points 0 points 0 points 0 points 0 points    Immunizations Immunization History  Administered Date(s) Administered   DTaP 04/24/2016   Fluad Quad(high Dose 65+) 06/10/2019   Influenza-Unspecified 07/08/2014, 07/29/2015, 06/10/2019, 06/21/2021, 06/09/2022, 07/20/2023   MODERNA COVID-19 SARS-COV-2 PEDS BIVALENT BOOSTER 50yr-37yr 07/14/2021   Moderna Covid-19 Vaccine Bivalent Booster 70yrs & up 07/14/2021   Moderna Sars-Covid-2 Vaccination 11/04/2019, 12/02/2019, 08/09/2020, 01/06/2021, 02/08/2022   PFIZER(Purple Top)SARS-COV-2 Vaccination 11/04/2019, 12/02/2019   Pfizer(Comirnaty)Fall Seasonal Vaccine 12 years and older 07/26/2022, 06/13/2023   Pneumococcal  Conjugate-13 11/10/2014, 12/07/2014   Pneumococcal Polysaccharide-23 01/20/2016   RSV,unspecified 08/08/2023   Td 06/05/2011   Tdap 06/05/2011, 06/05/2011   Zoster Recombinant(Shingrix) 07/10/2019, 03/09/2020   Zoster, Live 11/29/2015    TDAP status: Due, Education has been provided regarding  the importance of this vaccine. Advised may receive this vaccine at local pharmacy or Health Dept. Aware to provide a copy of the vaccination record if obtained from local pharmacy or Health Dept. Verbalized acceptance and understanding.  Flu Vaccine status: Up to date  Pneumococcal vaccine status: Up to date  Covid-19 vaccine status: Information provided on how to obtain vaccines.   Qualifies for Shingles Vaccine? Yes   Zostavax completed Yes   Shingrix Completed?: Yes  Screening Tests Health Maintenance  Topic Date Due   COVID-19 Vaccine (12 - 2024-25 season) 12/11/2023   Medicare Annual Wellness (AWV)  12/30/2024   DTaP/Tdap/Td (5 - Td or Tdap) 04/24/2026   Pneumonia Vaccine 2+ Years old  Completed   INFLUENZA VACCINE  Completed   DEXA SCAN  Completed   Hepatitis C Screening  Completed   Zoster Vaccines- Shingrix  Completed   HPV VACCINES  Aged Out   Colonoscopy  Discontinued    Health Maintenance  Health Maintenance Due  Topic Date Due   COVID-19 Vaccine (12 - 2024-25 season) 12/11/2023    Colorectal cancer screening: Type of screening: Colonoscopy. Completed 08/06/2018. Repeat every 10 years  Mammogram status: Completed 03/15/2023. Repeat every year  Bone Density status: Completed 02/01/2022. Results reflect: Bone density results: OSTEOPOROSIS. Repeat every 2 years.  Lung Cancer Screening: (Low Dose CT Chest recommended if Age 70-80 years, 20 pack-year currently smoking OR have quit w/in 15years.) does not qualify.   Lung Cancer Screening Referral: n/a  Additional Screening:  Hepatitis C Screening: does qualify; Completed 10/09/2016  Vision Screening: Recommended annual ophthalmology exams for early detection of glaucoma and other disorders of the eye. Is the patient up to date with their annual eye exam?  Yes  Who is the provider or what is the name of the office in which the patient attends annual eye exams? [facility] If pt is not established with a  provider, would they like to be referred to a provider to establish care?  N/a .   Dental Screening: Recommended annual dental exams for proper oral hygiene   Community Resource Referral / Chronic Care Management: CRR required this visit?  No   CCM required this visit?  No     Plan:     I have personally reviewed and noted the following in the patient's chart:   Medical and social history Use of alcohol, tobacco or illicit drugs  Current medications and supplements including opioid prescriptions. Patient is not currently taking opioid prescriptions. Functional ability and status Nutritional status Physical activity Advanced directives List of other physicians Hospitalizations, surgeries, and ER visits in previous 12 months Vitals. No ER visit or surgeries  Screenings to include cognitive, depression, and falls Referrals and appointments  In addition, I have reviewed and discussed with patient certain preventive protocols, quality metrics, and best practice recommendations. A written personalized care plan for preventive services as well as general preventive health recommendations were provided to patient.     Esmond Harps, CMA   12/31/2023   After Visit Summary: (MyChart) Due to this being a telephonic visit, the after visit summary with patients personalized plan was offered to patient via MyChart   Nurse Notes:    ANNIKAH LOVINS is  a 78 y.o. female patient of Christen Butter, NP who had a Medicare Annual Wellness Visit today via telephone. Lyndon is Retired and lives with their spouse. She has 2 children. She reports that she is socially active and does interact with friends/family regularly. She is moderately physically active and enjoys writing, gardening and research on the her computer.

## 2024-02-01 ENCOUNTER — Other Ambulatory Visit: Payer: Self-pay | Admitting: Medical-Surgical

## 2024-02-08 ENCOUNTER — Telehealth: Payer: Self-pay

## 2024-02-08 ENCOUNTER — Other Ambulatory Visit (HOSPITAL_COMMUNITY): Payer: Self-pay

## 2024-02-08 NOTE — Telephone Encounter (Signed)
 Prolia  VOB initiated via [email or web address]  Next Prolia  inj DUE: 03/10/24

## 2024-02-08 NOTE — Telephone Encounter (Signed)
 Aaron Aas

## 2024-02-08 NOTE — Telephone Encounter (Signed)
 PA for buy and bill submitted via CMM. Key: [id number]

## 2024-02-11 ENCOUNTER — Other Ambulatory Visit (HOSPITAL_COMMUNITY): Payer: Self-pay

## 2024-02-11 ENCOUNTER — Ambulatory Visit: Payer: Medicare PPO | Admitting: Medical-Surgical

## 2024-02-11 NOTE — Telephone Encounter (Signed)
 Pt ready for scheduling for PROLIA  on or after : 03/10/24  Option# 1: Buy/Bill (Office supplied medication)  Out-of-pocket cost due at time of clinic visit: $40  Number of injection/visits approved: 2  Primary: [employer] Prolia  co-insurance: $40 Admin fee co-insurance: 0%  Secondary: --- Prolia  co-insurance:  Admin fee co-insurance:   Medical Benefit Details: Date Benefits were checked: 02/08/24 Deductible: NO/ Coinsurance: $40/ Admin Fee: 0%  Prior Auth: APPROVED PA# [id number] Expiration Date: 09/04/22-10/08/24  # of doses approved: 2 ----------------------------------------------------------------------- Option# 2- Med Obtained from pharmacy:  Pharmacy benefit: Copay $64 (Paid to pharmacy) Admin Fee: 0% (Pay at clinic)  Prior Auth: N/A PA# Expiration Date:   # of doses approved:   If patient wants fill through the pharmacy benefit please send prescription to: [employer], and include estimated need by date in rx notes. Pharmacy will ship medication directly to the office.  Patient NOT eligible for Prolia  Copay Card. Copay Card can make patient's cost as little as $25. Link to apply: [email or web address]://[email or web address]  ** This summary of benefits is an estimation of the patient's out-of-pocket cost. Exact cost may very based on individual plan coverage.

## 2024-02-12 ENCOUNTER — Ambulatory Visit: Admitting: Medical-Surgical

## 2024-02-12 ENCOUNTER — Encounter: Payer: Self-pay | Admitting: Medical-Surgical

## 2024-02-12 VITALS — BP 129/75 | HR 59 | Resp 20 | Ht 63.5 in | Wt 135.1 lb

## 2024-02-12 DIAGNOSIS — E782 Mixed hyperlipidemia: Secondary | ICD-10-CM

## 2024-02-12 DIAGNOSIS — M810 Age-related osteoporosis without current pathological fracture: Secondary | ICD-10-CM

## 2024-02-12 DIAGNOSIS — I10 Essential (primary) hypertension: Secondary | ICD-10-CM

## 2024-02-12 DIAGNOSIS — E559 Vitamin D deficiency, unspecified: Secondary | ICD-10-CM

## 2024-02-12 MED ORDER — HYDROCHLOROTHIAZIDE 12.5 MG PO TABS: 12.5000 mg | ORAL_TABLET | Freq: Every day | ORAL | 3 refills | Status: AC

## 2024-02-12 NOTE — Progress Notes (Signed)
   Established patient visit  History, exam, impression, and plan:  1. Cough, unspecified type 2. Sore throat Heidi Barber 78 year old female presenting today with reports of upper respiratory symptoms.  Notes that this started approximately 5 days ago with her nose and eyes burning.  On Saturday, she felt unwell and by Sunday she notes that she felt awful.  Her throat has been sore and she has been coughing.  Notes that her cough has been occasionally productive of small amounts of pink-tinged sputum, usually in the morning.  Her left ear has now developed pressure/discomfort and she has significant sinus congestion.  She has tried drinking hot tea and increasing her fluid consumption.  She has also used Chloraseptic for the sore throat.  Continues to have significant issues with hoarseness.  She saw ENT and they recommended just using Atrovent nasal spray twice daily and follow-up with them after a couple of months.  POCT strep, flu, and COVID testing all negative today.  Below for physical exam. - POCT rapid strep A - POCT Influenza A/B - POC COVID-19  3. Viral URI with cough Despite negative testing, suspect that her symptoms are truly related to a viral URI.  Discussed the timeline for resolution of a viral illness since symptoms can last 7 to 14 days.  With her severe hoarseness and significant sinus congestion, treating with Decadron 4 mg twice daily.  Adding Tussionex for cough suppression.  Okay to use Tylenol/ibuprofen for fever/discomfort.  Continue conservative measures at home.  If improvement in symptoms not noted over the next 2 to 3 days or symptoms improved but quickly worsen again, consider adding antibiotic therapy for secondary bacterial infection.   Procedures performed this visit: None.  Return if symptoms worsen or fail to improve.  __________________________________ Thayer Ohm, DNP, APRN, FNP-BC Primary Care and Sports Medicine [facility] [place]

## 2024-02-13 ENCOUNTER — Encounter: Payer: Self-pay | Admitting: Medical-Surgical

## 2024-02-13 LAB — LIPID PANEL
Chol/HDL Ratio: 2.6 ratio (ref 0.0–4.4)
Cholesterol, Total: 164 mg/dL (ref 100–199)
HDL: 62 mg/dL (ref >39)
LDL Chol Calc (NIH): 86 mg/dL (ref 0–99)
Triglycerides: 84 mg/dL (ref 0–149)
VLDL Cholesterol Cal: 16 mg/dL (ref 5–40)

## 2024-02-13 LAB — CMP14+EGFR
ALT: 16 IU/L (ref 0–32)
AST: 20 IU/L (ref 0–40)
Albumin: 4.2 g/dL (ref 3.8–4.8)
Alkaline Phosphatase: 59 IU/L (ref 44–121)
BUN/Creatinine Ratio: 21 (ref 12–28)
BUN: 16 mg/dL (ref 8–27)
Bilirubin Total: 0.5 mg/dL (ref 0.0–1.2)
CO2: 23 mmol/L (ref 20–29)
Calcium: 9.5 mg/dL (ref 8.7–10.3)
Chloride: 101 mmol/L (ref 96–106)
Creatinine, Ser: 0.78 mg/dL (ref 0.57–1.00)
Globulin, Total: 2.3 g/dL (ref 1.5–4.5)
Glucose: 97 mg/dL (ref 70–99)
Potassium: 4.3 mmol/L (ref 3.5–5.2)
Sodium: 140 mmol/L (ref 134–144)
Total Protein: 6.5 g/dL (ref 6.0–8.5)
eGFR: 78 mL/min/{1.73_m2} (ref >59)

## 2024-02-13 LAB — CBC
Hematocrit: 41.7 % (ref 34.0–46.6)
Hemoglobin: 13.6 g/dL (ref 11.1–15.9)
MCH: 30.6 pg (ref 26.6–33.0)
MCHC: 32.6 g/dL (ref 31.5–35.7)
MCV: 94 fL (ref 79–97)
Platelets: 267 10*3/uL (ref 150–450)
RBC: 4.44 x10E6/uL (ref 3.77–5.28)
RDW: 13.4 % (ref 11.7–15.4)
WBC: 6.2 10*3/uL (ref 3.4–10.8)

## 2024-02-13 LAB — VITAMIN D 25 HYDROXY (VIT D DEFICIENCY, FRACTURES): Vit D, 25-Hydroxy: 40.1 ng/mL (ref 30.0–100.0)

## 2024-03-13 ENCOUNTER — Ambulatory Visit (INDEPENDENT_AMBULATORY_CARE_PROVIDER_SITE_OTHER): Payer: Medicare PPO

## 2024-03-13 VITALS — BP 137/57 | HR 72 | Resp 18 | Ht 63.5 in | Wt 135.0 lb

## 2024-03-13 DIAGNOSIS — M810 Age-related osteoporosis without current pathological fracture: Secondary | ICD-10-CM | POA: Diagnosis not present

## 2024-03-13 MED ORDER — DENOSUMAB 60 MG/ML ~~LOC~~ SOSY: 60.0000 mg | PREFILLED_SYRINGE | SUBCUTANEOUS | 0 refills | Status: DC

## 2024-03-13 MED ORDER — DENOSUMAB 60 MG/ML ~~LOC~~ SOSY: 60.0000 mg | PREFILLED_SYRINGE | SUBCUTANEOUS | 0 refills | Status: AC

## 2024-03-13 MED ORDER — DENOSUMAB 60 MG/ML ~~LOC~~ SOSY
60.0000 mg | PREFILLED_SYRINGE | Freq: Once | SUBCUTANEOUS | 0 refills | Status: DC
Start: 2024-03-13 — End: 2024-03-13

## 2024-03-13 NOTE — Progress Notes (Signed)
 Pt here today for her Prolia  injection. Approved on 03/10/24. Pt chose the bill/pay option.                              Pt given Prolia  60mg  in LUA. Pt tolerated well. No redness or swelling noted at the site. Pt advised to RTC in 6 months around 09/12/24

## 2024-04-14 ENCOUNTER — Telehealth: Payer: Self-pay

## 2024-04-14 NOTE — Telephone Encounter (Signed)
 Copied from CRM [id number]. Topic: Clinical - Prescription Issue >> Apr 10, 2024  1:26 PM Carrielelia G wrote: Reason for CRM: denosumab  (PROLIA ) 60 MG/ML SOSY injection  Linda calling from [facility] trying to reach patient   So that they can deliver the medication   Please advise

## 2024-04-18 NOTE — Telephone Encounter (Signed)
 Spoke with [facility] - informed them that patient had just had prolia  injection on March 13, 2024 and is not due again until December 2025.  No alternative numbers in chart for the  patient to give to representative in contacting the patient.

## 2024-08-07 ENCOUNTER — Telehealth: Payer: Self-pay

## 2024-08-14 ENCOUNTER — Encounter: Payer: Self-pay | Admitting: Medical-Surgical

## 2024-08-14 ENCOUNTER — Ambulatory Visit: Admitting: Medical-Surgical

## 2024-08-14 VITALS — BP 135/78 | HR 63 | Resp 20 | Ht 63.5 in | Wt 136.1 lb

## 2024-08-14 DIAGNOSIS — I10 Essential (primary) hypertension: Secondary | ICD-10-CM

## 2024-08-14 DIAGNOSIS — E559 Vitamin D deficiency, unspecified: Secondary | ICD-10-CM

## 2024-08-14 DIAGNOSIS — M810 Age-related osteoporosis without current pathological fracture: Secondary | ICD-10-CM

## 2024-08-14 DIAGNOSIS — Z1231 Encounter for screening mammogram for malignant neoplasm of breast: Secondary | ICD-10-CM

## 2024-08-14 DIAGNOSIS — E782 Mixed hyperlipidemia: Secondary | ICD-10-CM | POA: Diagnosis not present

## 2024-08-14 NOTE — Patient Instructions (Signed)
 Preventive Care 83 Years and Older, Female Preventive care refers to lifestyle choices and visits with your health care provider that can promote health and wellness. Preventive care visits are also called wellness exams. What can I expect for my preventive care visit? Counseling Your health care provider may ask you questions about your: Medical history, including: Past medical problems. Family medical history. Pregnancy and menstrual history. History of falls. Current health, including: Memory and ability to understand (cognition). Emotional well-being. Home life and relationship well-being. Sexual activity and sexual health. Lifestyle, including: Alcohol, nicotine or tobacco, and drug use. Access to firearms. Diet, exercise, and sleep habits. Work and work Astronomer. Sunscreen use. Safety issues such as seatbelt and bike helmet use. Physical exam Your health care provider will check your: Height and weight. These may be used to calculate your BMI (body mass index). BMI is a measurement that tells if you are at a healthy weight. Waist circumference. This measures the distance around your waistline. This measurement also tells if you are at a healthy weight and may help predict your risk of certain diseases, such as type 2 diabetes and high blood pressure. Heart rate and blood pressure. Body temperature. Skin for abnormal spots. What immunizations do I need?  Vaccines are usually given at various ages, according to a schedule. Your health care provider will recommend vaccines for you based on your age, medical history, and lifestyle or other factors, such as travel or where you work. What tests do I need? Screening Your health care provider may recommend screening tests for certain conditions. This may include: Lipid and cholesterol levels. Hepatitis C test. Hepatitis B test. HIV (human immunodeficiency virus) test. STI (sexually transmitted infection) testing, if you are at  risk. Lung cancer screening. Colorectal cancer screening. Diabetes screening. This is done by checking your blood sugar (glucose) after you have not eaten for a while (fasting). Mammogram. Talk with your health care provider about how often you should have regular mammograms. BRCA-related cancer screening. This may be done if you have a family history of breast, ovarian, tubal, or peritoneal cancers. Bone density scan. This is done to screen for osteoporosis. Talk with your health care provider about your test results, treatment options, and if necessary, the need for more tests. Follow these instructions at home: Eating and drinking  Eat a diet that includes fresh fruits and vegetables, whole grains, lean protein, and low-fat dairy products. Limit your intake of foods with high amounts of sugar, saturated fats, and salt. Take vitamin and mineral supplements as recommended by your health care provider. Do not drink alcohol if your health care provider tells you not to drink. If you drink alcohol: Limit how much you have to 0-1 drink a day. Know how much alcohol is in your drink. In the [place] one drink equals one 12 oz bottle of beer (355 mL), one 5 oz glass of wine (148 mL), or one 1 oz glass of hard liquor (44 mL). Lifestyle Brush your teeth every morning and night with fluoride toothpaste. Floss one time each day. Exercise for at least 30 minutes 5 or more days each week. Do not use any products that contain nicotine or tobacco. These products include cigarettes, chewing tobacco, and vaping devices, such as e-cigarettes. If you need help quitting, ask your health care provider. Do not use drugs. If you are sexually active, practice safe sex. Use a condom or other form of protection in order to prevent STIs. Take aspirin only as told by  your health care provider. Make sure that you understand how much to take and what form to take. Work with your health care provider to find out whether it  is safe and beneficial for you to take aspirin daily. Ask your health care provider if you need to take a cholesterol-lowering medicine (statin). Find healthy ways to manage stress, such as: Meditation, yoga, or listening to music. Journaling. Talking to a trusted person. Spending time with friends and family. Minimize exposure to UV radiation to reduce your risk of skin cancer. Safety Always wear your seat belt while driving or riding in a vehicle. Do not drive: If you have been drinking alcohol. Do not ride with someone who has been drinking. When you are tired or distracted. While texting. If you have been using any mind-altering substances or drugs. Wear a helmet and other protective equipment during sports activities. If you have firearms in your house, make sure you follow all gun safety procedures. What's next? Visit your health care provider once a year for an annual wellness visit. Ask your health care provider how often you should have your eyes and teeth checked. Stay up to date on all vaccines. This information is not intended to replace advice given to you by your health care provider. Make sure you discuss any questions you have with your health care provider. Document Revised: 03/23/2021 Document Reviewed: 03/23/2021 Elsevier Patient Education  2024 [employer].

## 2024-08-14 NOTE — Progress Notes (Signed)
 UTD on labs so plan to recheck at her next appointment in 6 months or sooner if needed.   Medical screening examination/treatment was performed by qualified clinical staff member and as supervising provider I was immediately available for consultation/collaboration. I have reviewed documentation and agree with assessment and plan.  Zada FREDRIK Palin, DNP, APRN, FNP-BC [facility] and Sports Medicine

## 2024-08-14 NOTE — Progress Notes (Signed)
 Established Patient Office Visit  Subjective   Patient ID: Heidi Barber, female    DOB: 1946-04-10  Age: 78 y.o. MRN: [id number]  No chief complaint on file.   HPI  78 year old female presents for a 6 month follow up on the following chronic diseases:  Essential Hypertension. She is currently taking hydrochlorothiazide  12.5 mg daily. She does not routinely check her blood pressures at home. Denies having any chest pains, heart palpitations, shortness of breath, or lower extremity edema.  Hyperlipidemia Currently taking simvastatin  40 mg daily. Last Lipid panel was drawn in May 2025. Denis having an side effects to her medications.  Osteoporosis Receives Prolia  injections every 6 months. Next injection is due on 12/5. Denies having any falls  Vitamin D  Deficiency Currently taking 2000 units of vitamin d  daily. Denies having any fatigue or changes in mood.  Review of Systems  Constitutional: Negative.   HENT: Negative.    Eyes: Negative.   Respiratory: Negative.    Cardiovascular: Negative.   Gastrointestinal: Negative.   Genitourinary: Negative.   Musculoskeletal: Negative.   Skin: Negative.   Neurological: Negative.   Endo/Heme/Allergies: Negative.   Psychiatric/Behavioral: Negative.        Objective:     There were no vitals taken for this visit. BP Readings from Last 3 Encounters:  03/13/24 (!) 137/57  02/12/24 129/75  08/13/23 133/74      Physical Exam Vitals and nursing note reviewed.  Constitutional:      General: She is not in acute distress.    Appearance: Normal appearance. She is normal weight.  Cardiovascular:     Rate and Rhythm: Normal rate and regular rhythm.     Pulses: Normal pulses.     Heart sounds: Normal heart sounds.  Pulmonary:     Effort: Pulmonary effort is normal.     Breath sounds: Normal breath sounds.  Neurological:     General: No focal deficit present.     Mental Status: She is alert and oriented to person, place,  and time.  Psychiatric:        Mood and Affect: Mood normal.        Behavior: Behavior normal.        Thought Content: Thought content normal.        Judgment: Judgment normal.     No results found for any visits on 08/14/24.  Last CBC Lab Results  Component Value Date   WBC 6.2 02/12/2024   HGB 13.6 02/12/2024   HCT 41.7 02/12/2024   MCV 94 02/12/2024   MCH 30.6 02/12/2024   RDW 13.4 02/12/2024   PLT 267 02/12/2024   Last metabolic panel Lab Results  Component Value Date   GLUCOSE 97 02/12/2024   NA 140 02/12/2024   K 4.3 02/12/2024   CL 101 02/12/2024   CO2 23 02/12/2024   BUN 16 02/12/2024   CREATININE 0.78 02/12/2024   EGFR 78 02/12/2024   CALCIUM 9.5 02/12/2024   PROT 6.5 02/12/2024   ALBUMIN 4.2 02/12/2024   LABGLOB 2.3 02/12/2024   BILITOT 0.5 02/12/2024   ALKPHOS 59 02/12/2024   AST 20 02/12/2024   ALT 16 02/12/2024   Last lipids Lab Results  Component Value Date   CHOL 164 02/12/2024   HDL 62 02/12/2024   LDLCALC 86 02/12/2024   TRIG 84 02/12/2024   CHOLHDL 2.6 02/12/2024   Last hemoglobin A1c No results found for: HGBA1C  The 10-year ASCVD risk score (Arnett DK, et al.,  2019) is: 32.3%    Assessment & Plan:   1. Age-related osteoporosis without current pathological fracture (Primary) -Currently on Prolia  injections every 6 months -Next Prolia  injection scheduled for 09/12/24 -BMP drawn today to check kidney function  2. Essential hypertension -Continue with current regimen on hydrochlorothiazide  12.5 mg  3. Mixed hyperlipidemia -Continue with current regime of simvastatin  40 mg daily  4. Vitamin D  deficiency -Continue taking Vitamin D  supplement 2000 units daily -recheck Vitamin D  labs in 6 months   Return in about 6 months (around 02/11/2025) for Follow up on Chronic Diseases.   Derrek JINNY Freund, NP Student

## 2024-08-15 ENCOUNTER — Ambulatory Visit: Payer: Self-pay | Admitting: Medical-Surgical

## 2024-08-15 LAB — BASIC METABOLIC PANEL WITH GFR
BUN/Creatinine Ratio: 22 (ref 12–28)
BUN: 18 mg/dL (ref 8–27)
CO2: 20 mmol/L (ref 20–29)
Calcium: 9.8 mg/dL (ref 8.7–10.3)
Chloride: 102 mmol/L (ref 96–106)
Creatinine, Ser: 0.81 mg/dL (ref 0.57–1.00)
Glucose: 95 mg/dL (ref 70–99)
Potassium: 5.1 mmol/L (ref 3.5–5.2)
Sodium: 141 mmol/L (ref 134–144)
eGFR: 74 mL/min/1.73 (ref >59)

## 2024-09-03 MED ORDER — DENOSUMAB 60 MG/ML ~~LOC~~ SOSY
60.0000 mg | PREFILLED_SYRINGE | Freq: Once | SUBCUTANEOUS | Status: AC
Start: 2024-09-12 — End: ?
  Administered 2024-09-18: 60 mg via SUBCUTANEOUS

## 2024-09-03 NOTE — Addendum Note (Signed)
 Addended by: Isac Lincks P on: 09/03/2024 04:22 PM   Modules accepted: Orders

## 2024-09-08 ENCOUNTER — Telehealth: Payer: Self-pay

## 2024-09-08 NOTE — Telephone Encounter (Signed)
 Prolia  VOB initiated via [email or web address]  Next Prolia  inj DUE: 09/12/24

## 2024-09-09 ENCOUNTER — Other Ambulatory Visit (HOSPITAL_COMMUNITY): Payer: Self-pay

## 2024-09-09 NOTE — Telephone Encounter (Signed)
 Pt ready for scheduling for PROLIA  on or after : 09/12/24  Option# 1: Buy/Bill (Office supplied medication)  Out-of-pocket cost due at time of clinic visit: $372  Number of injection/visits approved: 2  Primary: [employer]  Prolia  co-insurance: *UNDISCLOSED* Following Medicare Advantage guideline: 20% coinsurance Admin fee co-insurance: $40  Secondary: --- Prolia  co-insurance:  Admin fee co-insurance:   Medical Benefit Details: Date Benefits were checked: 09/08/24 Deductible: NO/ Coinsurance: 20%/ Admin Fee: $40  Prior Auth: APPROVED PA# [id number] Expiration Date: 09/04/22-10/08/24  # of doses approved: 2 ----------------------------------------------------------------------- Option# 2- Med Obtained from pharmacy:  Pharmacy benefit: Copay $64 (Paid to pharmacy) Admin Fee: $40 (Pay at clinic)  Prior Auth: N/A PA# Expiration Date:   # of doses approved:   If patient wants fill through the pharmacy benefit please send prescription to: [facility], and include estimated need by date in rx notes. Pharmacy will ship medication directly to the office.  Patient NOT eligible for Prolia  Copay Card. Copay Card can make patient's cost as little as $25. Link to apply: [email or web address]://[email or web address].[email or web address]/copay  ** This summary of benefits is an estimation of the patient's out-of-pocket cost. Exact cost may very based on individual plan coverage.

## 2024-09-09 NOTE — Telephone Encounter (Signed)
 SABRA

## 2024-09-12 ENCOUNTER — Other Ambulatory Visit: Payer: Self-pay

## 2024-09-12 ENCOUNTER — Ambulatory Visit

## 2024-09-12 MED ORDER — DENOSUMAB 60 MG/ML ~~LOC~~ SOSY
60.0000 mg | PREFILLED_SYRINGE | Freq: Once | SUBCUTANEOUS | 0 refills | Status: AC
  Filled 2024-09-16: qty 1, 180d supply, fill #0

## 2024-09-12 NOTE — Progress Notes (Signed)
 See benefits inv encounter from 09/08/24. Copay is $64.

## 2024-09-12 NOTE — Addendum Note (Signed)
 Addended by: Kao Berkheimer P on: 09/12/2024 11:42 AM   Modules accepted: Orders

## 2024-09-15 ENCOUNTER — Ambulatory Visit

## 2024-09-15 ENCOUNTER — Other Ambulatory Visit: Payer: Self-pay

## 2024-09-15 ENCOUNTER — Telehealth: Payer: Self-pay

## 2024-09-16 ENCOUNTER — Other Ambulatory Visit: Payer: Self-pay

## 2024-09-16 ENCOUNTER — Other Ambulatory Visit (HOSPITAL_COMMUNITY): Payer: Self-pay

## 2024-09-16 NOTE — Progress Notes (Signed)
 Specialty Pharmacy Initial Fill Coordination Note  Heidi Barber is a 78 y.o. female contacted today regarding initial fill of specialty medication(s) Denosumab  (PROLIA )   Patient requested Courier to Provider Office   Delivery date: 09/18/24   Verified address: [facility] [id number] [facility] [address]   Medication will be filled on: 09/17/24   Patient is aware of $64 copayment.

## 2024-09-16 NOTE — Telephone Encounter (Signed)
 Spoke with Heidi Barber. She states the medication should arrive on Thursday 09/18/2024 in the morning. Patient is scheduled for Thursday 09/18/2024 for 2:30pm for administration

## 2024-09-16 NOTE — Telephone Encounter (Signed)
 Attempted call to patient . Left a voice mail message requesting a return call. No medication has been received for tomorrow administration as patient did not pay co-pay with pharmacy . If she receives from our office supply - cost to her is significantly higher.   I requested a return call so that we could discuss this.

## 2024-09-16 NOTE — Telephone Encounter (Signed)
 Called Heidi Barber and left a voice mail message requesting a return call.

## 2024-09-17 ENCOUNTER — Other Ambulatory Visit: Payer: Self-pay

## 2024-09-17 ENCOUNTER — Other Ambulatory Visit (HOSPITAL_COMMUNITY): Payer: Self-pay

## 2024-09-17 ENCOUNTER — Ambulatory Visit

## 2024-09-18 ENCOUNTER — Ambulatory Visit

## 2024-09-18 VITALS — BP 137/66 | HR 75 | Ht 63.5 in

## 2024-09-18 DIAGNOSIS — M810 Age-related osteoporosis without current pathological fracture: Secondary | ICD-10-CM | POA: Diagnosis not present

## 2024-09-18 MED ORDER — DENOSUMAB 60 MG/ML ~~LOC~~ SOSY: 60.0000 mg | PREFILLED_SYRINGE | SUBCUTANEOUS | Status: AC

## 2024-09-18 NOTE — Progress Notes (Signed)
° °  Established Patient Office Visit  Subjective   Patient ID: Heidi Barber, female    DOB: [birth year]  Age: 78 y.o. MRN: [id number]  Chief Complaint  Patient presents with   age related Osteoporosis without current pathological fracu    Prolia  injection nurse visit     HPI  Age related Osteoporosis without current pathological fracture Prolia  injection - nurse visit. Last given 03/13/2024. Last lab work 08/14/2024 showing normal calcium and kidney function.   ROS    Objective:     BP 137/66   Pulse 75   Ht 5' 3.5 (1.613 m)   SpO2 99%   BMI 23.73 kg/m    Physical Exam   No results found for any visits on 09/18/24.    The 10-year ASCVD risk score (Arnett DK, et al., 2019) is: 32.3%    Assessment & Plan:  Prolia  injection - admin 60mg  SQ Right arm. Patient tolerated injection well without complications. Patient will return in 6 months and one day for next Prolia  injection as nurse visit. Patient is aware to return before  Prolia  scheduled date for blood work  to recheck calcium and kidney function. Problem List Items Addressed This Visit       Musculoskeletal and Integument   Age-related osteoporosis without current pathological fracture - Primary   Relevant Medications   denosumab  (PROLIA ) injection 60 mg (Start on 03/18/2025 12:00 AM)    Return in about 6 months (around 03/18/2025) for next injection of Prolia  as  nurse visit.    Suzen SHAUNNA Plenty, LPN

## 2024-09-18 NOTE — Patient Instructions (Signed)
 Return in 6 months and one day for next injection of Prolia  as nurse visit.

## 2024-12-03 ENCOUNTER — Other Ambulatory Visit

## 2024-12-31 ENCOUNTER — Ambulatory Visit

## 2025-02-12 ENCOUNTER — Ambulatory Visit: Admitting: Medical-Surgical

## 2025-03-20 ENCOUNTER — Ambulatory Visit
